# Patient Record
Sex: Female | Born: 1990
Health system: Southern US, Community
[De-identification: ages and names within clinical notes are randomized; demographics above are authoritative.]

## PROBLEM LIST (undated history)

## (undated) DIAGNOSIS — I10 Essential (primary) hypertension: Secondary | ICD-10-CM

## (undated) DIAGNOSIS — E119 Type 2 diabetes mellitus without complications: Secondary | ICD-10-CM

## (undated) DIAGNOSIS — F419 Anxiety disorder, unspecified: Secondary | ICD-10-CM

## (undated) DIAGNOSIS — F32A Depression, unspecified: Secondary | ICD-10-CM

## (undated) DIAGNOSIS — F609 Personality disorder, unspecified: Secondary | ICD-10-CM

---

## 2020-01-20 MED FILL — buPROPion HCL ER (SR) 100 M: 100 | 90 days supply | Qty: 180 | Fill #0

## 2020-01-20 MED FILL — SERTRALINE HCL 100 MG TAB: 100 | 90 days supply | Qty: 90 | Fill #0

## 2020-02-10 DIAGNOSIS — F4323 Adjustment disorder with mixed anxiety and depressed mood: Secondary | ICD-10-CM | POA: Diagnosis not present

## 2020-02-10 MED FILL — ESCITALOPRAM 20 MG TABLET: 20 | 30 days supply | Qty: 30 | Fill #0

## 2020-02-11 DIAGNOSIS — R42 Dizziness and giddiness: Secondary | ICD-10-CM | POA: Diagnosis not present

## 2020-02-11 DIAGNOSIS — E119 Type 2 diabetes mellitus without complications: Secondary | ICD-10-CM | POA: Diagnosis not present

## 2020-02-11 DIAGNOSIS — N915 Oligomenorrhea, unspecified: Secondary | ICD-10-CM | POA: Diagnosis not present

## 2020-02-11 DIAGNOSIS — I951 Orthostatic hypotension: Secondary | ICD-10-CM | POA: Diagnosis not present

## 2020-02-11 MED FILL — MECLIZINE 25 MG TABLET: 25 | 5 days supply | Qty: 10 | Fill #0

## 2020-02-18 DIAGNOSIS — I10 Essential (primary) hypertension: Secondary | ICD-10-CM | POA: Diagnosis not present

## 2020-02-18 DIAGNOSIS — E789 Disorder of lipoprotein metabolism, unspecified: Secondary | ICD-10-CM | POA: Diagnosis not present

## 2020-02-18 DIAGNOSIS — Z1159 Encounter for screening for other viral diseases: Secondary | ICD-10-CM | POA: Diagnosis not present

## 2020-02-18 DIAGNOSIS — E119 Type 2 diabetes mellitus without complications: Secondary | ICD-10-CM | POA: Diagnosis not present

## 2020-02-18 DIAGNOSIS — I951 Orthostatic hypotension: Secondary | ICD-10-CM | POA: Diagnosis not present

## 2020-02-18 DIAGNOSIS — R42 Dizziness and giddiness: Secondary | ICD-10-CM | POA: Diagnosis not present

## 2020-02-18 DIAGNOSIS — E559 Vitamin D deficiency, unspecified: Secondary | ICD-10-CM | POA: Diagnosis not present

## 2020-02-18 MED FILL — VIT D2 1.25 MG (50,000 UNIT: 1.25 MG | 90 days supply | Qty: 13 | Fill #0

## 2020-02-18 MED FILL — ROSUVASTATIN CALCIUM 20 MG: 20 | 90 days supply | Qty: 90 | Fill #0

## 2020-02-25 DIAGNOSIS — I10 Essential (primary) hypertension: Secondary | ICD-10-CM | POA: Diagnosis not present

## 2020-02-25 DIAGNOSIS — E789 Disorder of lipoprotein metabolism, unspecified: Secondary | ICD-10-CM | POA: Diagnosis not present

## 2020-02-25 DIAGNOSIS — E119 Type 2 diabetes mellitus without complications: Secondary | ICD-10-CM | POA: Diagnosis not present

## 2020-02-25 DIAGNOSIS — R42 Dizziness and giddiness: Secondary | ICD-10-CM | POA: Diagnosis not present

## 2020-02-26 DIAGNOSIS — R5383 Other fatigue: Secondary | ICD-10-CM | POA: Diagnosis not present

## 2020-02-26 DIAGNOSIS — E1165 Type 2 diabetes mellitus with hyperglycemia: Secondary | ICD-10-CM | POA: Diagnosis not present

## 2020-02-26 DIAGNOSIS — Z7251 High risk heterosexual behavior: Secondary | ICD-10-CM | POA: Diagnosis not present

## 2020-02-26 DIAGNOSIS — R0602 Shortness of breath: Secondary | ICD-10-CM | POA: Diagnosis not present

## 2020-02-26 DIAGNOSIS — R87612 Low grade squamous intraepithelial lesion on cytologic smear of cervix (LGSIL): Secondary | ICD-10-CM | POA: Diagnosis not present

## 2020-02-26 DIAGNOSIS — Z1159 Encounter for screening for other viral diseases: Secondary | ICD-10-CM | POA: Diagnosis not present

## 2020-02-26 DIAGNOSIS — N898 Other specified noninflammatory disorders of vagina: Secondary | ICD-10-CM | POA: Diagnosis not present

## 2020-03-11 DIAGNOSIS — D500 Iron deficiency anemia secondary to blood loss (chronic): Secondary | ICD-10-CM | POA: Diagnosis not present

## 2020-03-11 DIAGNOSIS — R87612 Low grade squamous intraepithelial lesion on cytologic smear of cervix (LGSIL): Secondary | ICD-10-CM | POA: Diagnosis not present

## 2020-03-11 DIAGNOSIS — I10 Essential (primary) hypertension: Secondary | ICD-10-CM | POA: Diagnosis not present

## 2020-03-11 DIAGNOSIS — E119 Type 2 diabetes mellitus without complications: Secondary | ICD-10-CM | POA: Diagnosis not present

## 2020-03-18 DIAGNOSIS — F4323 Adjustment disorder with mixed anxiety and depressed mood: Secondary | ICD-10-CM | POA: Diagnosis not present

## 2020-03-22 DIAGNOSIS — Z03818 Encounter for observation for suspected exposure to other biological agents ruled out: Secondary | ICD-10-CM | POA: Diagnosis not present

## 2020-03-22 DIAGNOSIS — Z20828 Contact with and (suspected) exposure to other viral communicable diseases: Secondary | ICD-10-CM | POA: Diagnosis not present

## 2020-04-14 DIAGNOSIS — I10 Essential (primary) hypertension: Secondary | ICD-10-CM | POA: Diagnosis not present

## 2020-04-14 DIAGNOSIS — E559 Vitamin D deficiency, unspecified: Secondary | ICD-10-CM | POA: Diagnosis not present

## 2020-04-14 DIAGNOSIS — R1013 Epigastric pain: Secondary | ICD-10-CM | POA: Diagnosis not present

## 2020-04-14 DIAGNOSIS — E119 Type 2 diabetes mellitus without complications: Secondary | ICD-10-CM | POA: Diagnosis not present

## 2020-04-14 DIAGNOSIS — N915 Oligomenorrhea, unspecified: Secondary | ICD-10-CM | POA: Diagnosis not present

## 2020-04-14 DIAGNOSIS — E789 Disorder of lipoprotein metabolism, unspecified: Secondary | ICD-10-CM | POA: Diagnosis not present

## 2020-06-03 MED FILL — ESCITALOPRAM 20 MG TABLET: 20 | 30 days supply | Qty: 30 | Fill #0

## 2020-06-03 MED FILL — buPROPion HCL ER (SR) 100 M: 100 | 90 days supply | Qty: 180 | Fill #0

## 2020-08-27 ENCOUNTER — Emergency Department (INDEPENDENT_AMBULATORY_CARE_PROVIDER_SITE_OTHER)
Admission: RE | Admit: 2020-08-27 | Discharge: 2020-08-27 | Disposition: A | Discharge status: Home / Self Care | Payer: Self-pay | Source: Ambulatory Visit

## 2020-08-27 ENCOUNTER — Other Ambulatory Visit (HOSPITAL_COMMUNITY)
Admission: RE | Admit: 2020-08-27 | Discharge: 2020-08-27 | Disposition: A | Discharge status: Home / Self Care | Payer: Self-pay | Source: Ambulatory Visit | Attending: Family Medicine | Admitting: Family Medicine

## 2020-08-27 ENCOUNTER — Other Ambulatory Visit: Payer: Self-pay

## 2020-08-27 VITALS — BP 128/84 | HR 101 | Temp 98.0°F | Resp 18

## 2020-08-27 DIAGNOSIS — R300 Dysuria: Secondary | ICD-10-CM

## 2020-08-27 DIAGNOSIS — B373 Candidiasis of vulva and vagina: Secondary | ICD-10-CM

## 2020-08-27 DIAGNOSIS — B3731 Acute candidiasis of vulva and vagina: Secondary | ICD-10-CM

## 2020-08-27 LAB — POCT URINALYSIS DIP (MANUAL ENTRY)
Bilirubin, UA: NEGATIVE
Blood, UA: NEGATIVE
Glucose, UA: >=1000 mg/dL — AB
Leukocytes, UA: NEGATIVE
Nitrite, UA: NEGATIVE
Protein Ur, POC: NEGATIVE mg/dL
Spec Grav, UA: 1.015 (ref 1.010–1.025)
Urobilinogen, UA: 0.2 E.U./dL
pH, UA: 5 (ref 5.0–8.0)

## 2020-08-27 MED ORDER — NYSTATIN 100000 UNIT/GM EX CREA: TOPICAL_CREAM | CUTANEOUS | 0 refills | Status: DC

## 2020-08-27 MED ORDER — FLUCONAZOLE 200 MG PO TABS: 200.0000 mg | ORAL_TABLET | Freq: Every day | ORAL | 0 refills | Status: DC

## 2020-08-27 NOTE — ED Provider Notes (Signed)
[facility]    CSN: [id number] Arrival date & time: 08/27/20  1728      History   Chief Complaint Chief Complaint  Patient presents with  . Vaginitis    HPI Kelly Sweeney is a 29 y.o. female.   HPI  Patient with a history of diabetes presents with recurrent vaginitis symptoms. She endorses vaginal itching, thick vaginal discharge,   and burning. Denies urine burns skin when urinating no actual dysuria. Reports she is getting diabetes under control. She has a PCP. History reviewed. No pertinent past medical history.  There are no problems to display for this patient.   History reviewed. No pertinent surgical history.  OB History   No obstetric history on file.      Home Medications    Prior to Admission medications   Medication Sig Start Date End Date Taking? Authorizing Provider  buPROPion (WELLBUTRIN SR) 100 MG 12 hr tablet Take by mouth. 06/03/20  Yes [provider]  Dexlansoprazole (DEXILANT) 30 MG capsule Take by mouth. 08/03/20  Yes [provider]  escitalopram (LEXAPRO) 20 MG tablet Take by mouth. 06/03/20  Yes [provider]  lisinopril (ZESTRIL) 20 MG tablet Take by mouth. 11/05/19  Yes [provider]  metFORMIN (GLUCOPHAGE-XR) 500 MG 24 hr tablet Take by mouth. 06/22/20  Yes [provider]  rosuvastatin (CRESTOR) 20 MG tablet Take by mouth. 02/18/20  Yes [provider]  Semaglutide, 1 MG/DOSE, (OZEMPIC, 1 MG/DOSE,) 2 MG/1.5ML SOPN Inject into the skin. 08/03/20  Yes [provider]    Family History History reviewed. No pertinent family history.  Social History Social History   Tobacco Use  . Smoking status: Never Smoker  . Smokeless tobacco: Never Used  Vaping Use  . Vaping Use: Never used  Substance Use Topics  . Alcohol use: Not on file  . Drug use: Not Currently     Allergies   Patient has no known allergies. Review of Systems Review of Systems Pertinent negatives  listed in HPI  Physical Exam Triage Vital Signs ED Triage Vitals  Enc Vitals Group     BP 08/27/20 1746 128/84     Pulse Rate 08/27/20 1746 (!) 101     Resp 08/27/20 1746 18     Temp 08/27/20 1746 98 F (36.7 C)     Temp Source 08/27/20 1746 Oral     SpO2 08/27/20 1746 99 %     Weight --      Height --      Head Circumference --      Peak Flow --      Pain Score 08/27/20 1747 0     Pain Loc --      Pain Edu? --      Excl. in GC? --    No data found.  Updated Vital Signs BP 128/84 (BP Location: Right Arm)   Pulse (!) 101   Temp 98 F (36.7 C) (Oral)   Resp 18   LMP 07/28/2020 (Exact Date)   SpO2 99%   Visual Acuity Right Eye Distance:   Left Eye Distance:   Bilateral Distance:    Right Eye Near:   Left Eye Near:    Bilateral Near:     Physical Exam General appearance: alert, well developed, well nourished, cooperative and in no distress Head: Normocephalic, without obvious abnormality, atraumatic Respiratory: Respirations even and unlabored, normal respiratory rate Heart: Increase heart rate and rhythm normal. No gallop or murmurs Skin: Skin color,  texture, turgor normal. No rashes seen  Psych: Appropriate mood and affect. Vaginal cytology self collected  UC Treatments / Results  Labs (all labs ordered are listed, but only abnormal results are displayed) Labs Reviewed - No data to display  EKG   Radiology No results found.  Procedures Procedures (including critical care time)  Medications Ordered in UC Medications - No data to display  Initial Impression / Assessment and Plan / UC Course  I have reviewed the triage vital signs and the nursing notes.  Pertinent labs & imaging results that were available during my care of the patient were reviewed by me and considered in my medical decision making (see chart for details).    Vaginal cytology pending. Treating for acute candidal infection given symptoms. UA negative with the exception glucose in  urine. Patient advised to follow-up with PCP for ongoing diabetes management. Final Clinical Impressions(s) / UC Diagnoses   Final diagnoses:  Candidiasis of female genitalia  Dysuria     Discharge Instructions     Vaginal cytology will results within 3-5 days. If vaginal cytology is significant for bacterial vaginosis , I will prescribed metronidazole at that time.    ED Prescriptions    Medication Sig Dispense Auth. Provider   fluconazole (DIFLUCAN) 200 MG tablet Take 1 tablet (200 mg total) by mouth daily for 7 days. 7 tablet Bing Neighbors, FNP   nystatin cream (MYCOSTATIN) Apply to affected area 2 times daily 120 g Bing Neighbors, FNP     PDMP not reviewed this encounter.   Bing Neighbors, FNP 08/30/20 1520

## 2020-08-27 NOTE — Discharge Instructions (Addendum)
Vaginal cytology will results within 3-5 days. If vaginal cytology is significant for bacterial vaginosis , I will prescribed metronidazole at that time.

## 2020-08-27 NOTE — ED Triage Notes (Signed)
Pt c/o possible yeast infection sxs x 1 week. Vaginal itching and irritation. Was treated in July for an extended time for BV, (generic flagyl 56 tabs worth) which she says cleared up a lot but never completely went away.

## 2020-08-28 ENCOUNTER — Telehealth: Payer: Self-pay | Admitting: Emergency Medicine

## 2020-08-28 DIAGNOSIS — B3731 Acute candidiasis of vulva and vagina: Secondary | ICD-10-CM | POA: Insufficient documentation

## 2020-08-28 MED ORDER — FLUCONAZOLE 200 MG PO TABS: 200.0000 mg | ORAL_TABLET | Freq: Every day | ORAL | 0 refills | Status: AC

## 2020-08-28 MED ORDER — NYSTATIN 100000 UNIT/GM EX CREA
TOPICAL_CREAM | CUTANEOUS | 0 refills | Status: DC
Start: 2020-08-28 — End: 2022-07-17

## 2020-08-28 NOTE — Telephone Encounter (Signed)
Call from pt at 0806 about no meds - RN to call pharmacy to check status.Call from pt to check on e-scripts for medication prescribed yesterday. Pt stated medicine was not available at [facility]. Attempt to call pharmacy - on hold - no answer for 15 min. Call back from pt regarding meds while on hold. Will resend meds to pharmacy.

## 2020-08-28 NOTE — Telephone Encounter (Signed)
2nd attempt to call pharmacy - on hold for 12 min- call back at 1200, message left regarding a call back about pt's meds to confirm receipt of e-script or if a a verbal was needed

## 2020-08-28 NOTE — Telephone Encounter (Signed)
Call back to pt - message left regarding meds - pt had called back & spoken to Delice Bison about meds being sent electronically to [facility]- electronic scripting is not sending scripts correctly. Verbal prescription to [facility] [address] in [place] & should be available for pick up. Pt had told Delice Bison she was going out of town. RN apologized for delay in med refill.

## 2020-08-31 LAB — CERVICOVAGINAL ANCILLARY ONLY
Bacterial Vaginitis (gardnerella): NEGATIVE
Candida Glabrata: NEGATIVE
Candida Vaginitis: POSITIVE — AB
Chlamydia: NEGATIVE
Comment: NEGATIVE
Comment: NEGATIVE
Comment: NEGATIVE
Comment: NEGATIVE
Comment: NEGATIVE
Comment: NORMAL
Neisseria Gonorrhea: NEGATIVE
Trichomonas: NEGATIVE

## 2020-09-02 ENCOUNTER — Telehealth: Payer: Self-pay | Admitting: Emergency Medicine

## 2020-09-02 NOTE — Telephone Encounter (Signed)
Call from [facility] regarding lab results - all negative except for yeast which patient was treated for at visit. RN suggested pt follow up with GYN or PCP for continued vulvar itching. Pt hung up on nurse

## 2021-02-18 ENCOUNTER — Other Ambulatory Visit: Payer: Self-pay

## 2021-02-18 ENCOUNTER — Emergency Department (HOSPITAL_BASED_OUTPATIENT_CLINIC_OR_DEPARTMENT_OTHER)
Admission: EM | Admit: 2021-02-18 | Discharge: 2021-02-18 | Disposition: A | Discharge status: Home / Self Care | Payer: PRIVATE HEALTH INSURANCE | Attending: Emergency Medicine | Admitting: Emergency Medicine

## 2021-02-18 ENCOUNTER — Encounter (HOSPITAL_BASED_OUTPATIENT_CLINIC_OR_DEPARTMENT_OTHER): Payer: Self-pay

## 2021-02-18 DIAGNOSIS — N760 Acute vaginitis: Secondary | ICD-10-CM | POA: Insufficient documentation

## 2021-02-18 DIAGNOSIS — I10 Essential (primary) hypertension: Secondary | ICD-10-CM | POA: Diagnosis not present

## 2021-02-18 DIAGNOSIS — Z79899 Other long term (current) drug therapy: Secondary | ICD-10-CM | POA: Insufficient documentation

## 2021-02-18 DIAGNOSIS — E119 Type 2 diabetes mellitus without complications: Secondary | ICD-10-CM | POA: Diagnosis not present

## 2021-02-18 DIAGNOSIS — B9689 Other specified bacterial agents as the cause of diseases classified elsewhere: Secondary | ICD-10-CM

## 2021-02-18 DIAGNOSIS — M545 Low back pain, unspecified: Secondary | ICD-10-CM | POA: Diagnosis not present

## 2021-02-18 DIAGNOSIS — Z794 Long term (current) use of insulin: Secondary | ICD-10-CM | POA: Diagnosis not present

## 2021-02-18 DIAGNOSIS — Z7984 Long term (current) use of oral hypoglycemic drugs: Secondary | ICD-10-CM | POA: Diagnosis not present

## 2021-02-18 DIAGNOSIS — R350 Frequency of micturition: Secondary | ICD-10-CM | POA: Diagnosis present

## 2021-02-18 HISTORY — DX: Essential (primary) hypertension: I10

## 2021-02-18 HISTORY — DX: Type 2 diabetes mellitus without complications: E11.9

## 2021-02-18 LAB — URINALYSIS, ROUTINE W REFLEX MICROSCOPIC
Bilirubin Urine: NEGATIVE
Glucose, UA: NEGATIVE mg/dL
Hgb urine dipstick: NEGATIVE
Ketones, ur: 15 mg/dL — AB
Leukocytes,Ua: NEGATIVE
Nitrite: NEGATIVE
Protein, ur: NEGATIVE mg/dL
Specific Gravity, Urine: >1.03 — ABNORMAL HIGH (ref 1.005–1.030)
pH: 6 (ref 5.0–8.0)

## 2021-02-18 LAB — WET PREP, GENITAL
Sperm: NONE SEEN
Trich, Wet Prep: NONE SEEN
Yeast Wet Prep HPF POC: NONE SEEN

## 2021-02-18 LAB — PREGNANCY, URINE: Preg Test, Ur: NEGATIVE

## 2021-02-18 MED ORDER — METRONIDAZOLE 500 MG PO TABS: 500.0000 mg | ORAL_TABLET | Freq: Two times a day (BID) | ORAL | 0 refills | Status: DC

## 2021-02-18 NOTE — ED Provider Notes (Signed)
[facility] EMERGENCY DEPARTMENT Provider Note   CSN: [id number] Arrival date & time: 02/18/21  1926     History Chief Complaint  Patient presents with  . Flank Pain    Kelly Sweeney is a 30 y.o. female with a past medical history of DM, hypertension, who presents today for evaluation of pain in her lower back and side, left greater than right for a week.  She states that she just finished a week of Macrobid.  She had a telemedicine visit and based on her dysuria and frequency was diagnosed with a UTI.  She did not give a urine sample.  She states that her dysuria has improved however she still has frequency and her back pain.  She is sexually active.  She denies any abnormal discharge however does state that her partner has had multiple other partners.  She states that she has had HIV and syphilis testing since she found this out however has not had testing for gonorrhea and chlamydia.  She denies any fevers..  She states that she had a urinary tract in January of this year and was treated with Augmentin.  She denies any fevers.    HPI     Past Medical History:  Diagnosis Date  . Diabetes mellitus without complication (HCC)   . Hypertension     Patient Active Problem List   Diagnosis Date Noted  . Yeast infection involving the vagina and surrounding area 08/28/2020    History reviewed. No pertinent surgical history.   OB History   No obstetric history on file.     No family history on file.  Social History   Tobacco Use  . Smoking status: Never Smoker  . Smokeless tobacco: Never Used  Vaping Use  . Vaping Use: Never used  Substance Use Topics  . Alcohol use: Never  . Drug use: Not Currently    Home Medications Prior to Admission medications   Medication Sig Start Date End Date Taking? Authorizing Provider  metroNIDAZOLE (FLAGYL) 500 MG tablet Take 1 tablet (500 mg total) by mouth 2 (two) times daily. 02/18/21  Yes Cristina Gong, PA-C   buPROPion [facility] SR) 100 MG 12 hr tablet Take by mouth. 06/03/20   [provider]  Dexlansoprazole (DEXILANT) 30 MG capsule Take by mouth. 08/03/20   [provider]  escitalopram (LEXAPRO) 20 MG tablet Take by mouth. 06/03/20   [provider]  lisinopril (ZESTRIL) 20 MG tablet Take by mouth. 11/05/19   [provider]  metFORMIN (GLUCOPHAGE-XR) 500 MG 24 hr tablet Take by mouth. 06/22/20   [provider]  nystatin cream (MYCOSTATIN) Apply to affected area 2 times daily 08/28/20   Bing Neighbors, FNP  rosuvastatin (CRESTOR) 20 MG tablet Take by mouth. 02/18/20   [provider]  Semaglutide, 1 MG/DOSE, (OZEMPIC, 1 MG/DOSE,) 2 MG/1.5ML SOPN Inject into the skin. 08/03/20   [provider]    Allergies    Patient has no known allergies.  Review of Systems   Review of Systems  Constitutional: Negative for chills and fever.  Respiratory: Negative for shortness of breath.   Cardiovascular: Negative for chest pain.  Gastrointestinal: Negative for abdominal pain.  Genitourinary: Positive for frequency. Negative for difficulty urinating, dysuria, pelvic pain, urgency and vaginal discharge.  Musculoskeletal: Positive for back pain.  Neurological: Negative for weakness and headaches.  Psychiatric/Behavioral: The patient is nervous/anxious ("My heart rate is normally high because I get anxious").   All other systems reviewed  and are negative.   Physical Exam Updated Vital Signs BP 112/79 (BP Location: Left Arm)   Pulse 95   Temp 98.3 F (36.8 C) (Oral)   Resp 17   Ht 5\' 9"  (1.753 m)   Wt (!) 140.2 kg   LMP 02/06/2021   SpO2 100%   BMI 45.63 kg/m   Physical Exam Vitals and nursing note reviewed.  Constitutional:      General: She is not in acute distress.    Appearance: She is not diaphoretic.  HENT:     Head: Normocephalic and atraumatic.  Eyes:     General: No scleral icterus.       Right eye: No discharge.         Left eye: No discharge.     Conjunctiva/sclera: Conjunctivae normal.  Cardiovascular:     Rate and Rhythm: Normal rate and regular rhythm.  Pulmonary:     Effort: Pulmonary effort is normal. No respiratory distress.     Breath sounds: No stridor.  Abdominal:     General: There is no distension.     Tenderness: There is no abdominal tenderness. There is no right CVA tenderness, left CVA tenderness or guarding.  Musculoskeletal:        General: No deformity.     Cervical back: Normal range of motion.     Comments: Mild tenderness to palpation in bilateral lower back.  No midline L-spine tenderness to palpation, step-offs, or deformities.  Skin:    General: Skin is warm and dry.  Neurological:     Mental Status: She is alert.     Motor: No abnormal muscle tone.     Comments: Patient is awake and alert, answers questions appropriately.  Speech is not slurred.  Psychiatric:        Behavior: Behavior normal.     ED Results / Procedures / Treatments   Labs (all labs ordered are listed, but only abnormal results are displayed) Labs Reviewed  WET PREP, GENITAL - Abnormal; Notable for the following components:      Result Value   Clue Cells Wet Prep HPF POC PRESENT (*)    WBC, Wet Prep HPF POC FEW (*)    All other components within normal limits  URINALYSIS, ROUTINE W REFLEX MICROSCOPIC - Abnormal; Notable for the following components:   Specific Gravity, Urine >1.030 (*)    Ketones, ur 15 (*)    All other components within normal limits  URINE CULTURE  PREGNANCY, URINE  GC/CHLAMYDIA PROBE AMP ([place]) NOT AT [facility]    EKG None  Radiology No results found.  Procedures Procedures   Medications Ordered in ED Medications - No data to display  ED Course  I have reviewed the triage vital signs and the nursing notes.  Pertinent labs & imaging results that were available during my care of the patient were reviewed by me and considered in my medical decision making (see  chart for details).  Clinical Course as of 02/18/21 2329  2330 Feb 18, 2021  2036 Discussed patient's UA and pregnancy results with her.  She then requested testing for STDs.  She states that she recently found out that her partner has had multiple other partners and they did not use barrier protection.  She states that she is not having discharge.  We discussed role of pelvic exam versus self swab.  We discussed that without pelvic exam unable to evaluate and detect pelvic inflammatory disease and the possible implications of delaying or  missing this diagnosis including worsening infection, loss of fertility and others.  She states her understanding and wishes to self swab at this time.  She does not wish for empiric treatment. [EH]    Clinical Course User Index [EH] Norman Clay   MDM Rules/Calculators/A&P                         Patient is a 30 year old woman who presents today for evaluation of back pain and urinary frequency.  She states that she had been seen a week ago through a telemedicine visit without getting a urine sample and started on Macrobid for UTI which improved the dysuria however she still has frequency.  She has pain in her lower back that is worsened with palpation over the lateral lumbar paraspinal muscles without CVA tenderness to percussion.  Urine is obtained showing dehydration and mild ketonuria without evidence of infection.  Pregnancy test is negative.  After this result patient then requested STI testing for gonorrhea and chlamydia.  She declined testing for HIV and syphilis. We discussed pelvic exam.  We discussed that if she chooses to self swab that limits my ability to evaluate for PID and other conditions.  We discussed possible risks of this decision, she states her understanding and wishes to self swab which was obtained.  Wet prep does show BV.  She is given a prescription for Flagyl with instructions not to drink alcohol while taking this. GC  testing is sent.  She refused empiric treatment, and is aware that she will need to follow-up on these results.  She does not have significant abdominal pain on exam, and I am able to recreate and exacerbate her bilateral low back pain with palpation.  I suspect musculoskeletal pain.  She states that she sits for extended periods of time for work.  Recommended outpatient follow-up.  Her heart rate was slightly elevated however she states this is normal for her and attributes that to anxiety being in the emergency room.  This is supported by her heart rate improving spontaneously without specific treatment while in the ER.  Return precautions were discussed with patient who states their understanding.  At the time of discharge patient denied any unaddressed complaints or concerns.  Patient is agreeable for discharge home.  Note: Portions of this report may have been transcribed using voice recognition software. Every effort was made to ensure accuracy; however, inadvertent computerized transcription errors may be present  Final Clinical Impression(s) / ED Diagnoses Final diagnoses:  BV (bacterial vaginosis)  Acute bilateral low back pain without sciatica    Rx / DC Orders ED Discharge Orders         Ordered    metroNIDAZOLE (FLAGYL) 500 MG tablet  2 times daily        02/18/21 2107           Norman Clay 02/18/21 2329    Terrilee Files, MD 02/19/21 1104

## 2021-02-18 NOTE — Discharge Instructions (Signed)
You have test for gonorrhea and chlamydia pending.  If these are positive you will need to seek treatment.  If they are positive you should receive a call, however I would also recommend following these in your MyChart.  A registration code for MyChart if you do not already have it will be included on your discharge papers. Please monitor yourself.  If you develop fevers, worsening symptoms or have other concerns please seek additional medical care and evaluation. Please schedule a follow-up appointment with your primary care doctor and/or OB/GYN to follow-up on your concerns today.  You may have diarrhea from the antibiotics.  It is very important that you continue to take the antibiotics even if you get diarrhea unless a medical professional tells you that you may stop taking them.  If you stop too early the bacteria you are being treated for will become stronger and you may need different, more powerful antibiotics that have more side effects and worsening diarrhea.  Please stay well hydrated and consider probiotics as they may decrease the severity of your diarrhea.  Please be aware that if you take any hormonal contraception (birth control pills, nexplanon, the ring, etc) that your birth control will not work while you are taking antibiotics and you need to use back up protection as directed on the birth control medication information insert.   Today your diagnosed with bacterial vaginosis and received a prescription for metronidazole also known as Flagyl. It is very important that you do not consume any alcohol while taking this medication as it will cause you to become violently ill.

## 2021-02-18 NOTE — ED Triage Notes (Signed)
Pt arrives with c/o lower back pain and side pain (left and right) states that she just finished a 7 day course of antibiotics for a UTI.

## 2021-02-19 ENCOUNTER — Ambulatory Visit (HOSPITAL_COMMUNITY): Payer: Self-pay

## 2021-02-20 LAB — URINE CULTURE

## 2021-02-21 LAB — GC/CHLAMYDIA PROBE AMP (~~LOC~~) NOT AT ARMC
Chlamydia: NEGATIVE
Comment: NEGATIVE
Comment: NORMAL
Neisseria Gonorrhea: NEGATIVE

## 2021-04-07 ENCOUNTER — Emergency Department (HOSPITAL_COMMUNITY)
Admission: EM | Admit: 2021-04-07 | Discharge: 2021-04-07 | Disposition: A | Discharge status: Home / Self Care | Payer: PRIVATE HEALTH INSURANCE | Attending: Emergency Medicine | Admitting: Emergency Medicine

## 2021-04-07 ENCOUNTER — Encounter (HOSPITAL_COMMUNITY): Payer: Self-pay

## 2021-04-07 DIAGNOSIS — R45851 Suicidal ideations: Secondary | ICD-10-CM | POA: Insufficient documentation

## 2021-04-07 DIAGNOSIS — E119 Type 2 diabetes mellitus without complications: Secondary | ICD-10-CM | POA: Insufficient documentation

## 2021-04-07 DIAGNOSIS — Z7984 Long term (current) use of oral hypoglycemic drugs: Secondary | ICD-10-CM | POA: Diagnosis not present

## 2021-04-07 DIAGNOSIS — I10 Essential (primary) hypertension: Secondary | ICD-10-CM | POA: Diagnosis not present

## 2021-04-07 DIAGNOSIS — F411 Generalized anxiety disorder: Secondary | ICD-10-CM | POA: Diagnosis not present

## 2021-04-07 DIAGNOSIS — F419 Anxiety disorder, unspecified: Secondary | ICD-10-CM

## 2021-04-07 DIAGNOSIS — Z79899 Other long term (current) drug therapy: Secondary | ICD-10-CM | POA: Insufficient documentation

## 2021-04-07 MED ORDER — HYDROXYZINE HCL 25 MG PO TABS: 25.0000 mg | ORAL_TABLET | Freq: Three times a day (TID) | ORAL | 0 refills | Status: DC | PRN

## 2021-04-07 NOTE — Discharge Instructions (Addendum)
You came to the emergency department today to be evaluated for your anxiety and suicidal thoughts.  After speaking with a psychiatric provider they deemed you safe to return home and follow-up with your psychiatrist.  I have given you information to follow-up with a local therapist.  I have started you on Vistaril medication.  You may take 1 pill every 8 hours as needed for anxiety.  Please see attached information on Vistaril medication as well as suicidal feelings, and anxiety.  Get help right away if: You are talking about suicide or wishing to die. You start making plans for how to commit suicide. You feel that you have no reason to live. You start making plans for putting your affairs in order, saying goodbye, or giving your possessions away. You feel guilt, shame, or unbearable pain, and it seems like there is no way out. You are frequently using drugs or alcohol. You are engaging in risky behaviors that could lead to death.

## 2021-04-07 NOTE — ED Notes (Signed)
Provider declines lab work at this time. Waiting on TTS consult.

## 2021-04-07 NOTE — ED Notes (Signed)
Pt belongings removed and patient changed into maroon scrubs. Pt belongings x1 bag placed in triage lockers.

## 2021-04-07 NOTE — ED Triage Notes (Signed)
Pt presents via EMS for anxiety and insomnia. Per EMS, pt has had recent changes in her anxiety medications. She is no longer taking Xanax or Wellbutrin. Pt is only taking Lexapro at this time which she states she started around two to three weeks ago. Pt has a follow-up appointment with her prescriber next week. Pt denies any HI or SI at this time and states she has been under increased stress d/t family member health issues.

## 2021-04-07 NOTE — ED Provider Notes (Signed)
[facility]-EMERGENCY DEPT Provider Note   CSN: [id number] Arrival date & time: 04/07/21  1228     History Chief Complaint  Patient presents with  . Anxiety    Kelly Sweeney is a 30 y.o. female with history of anxiety, depression, hypertension, diabetes.  Patient presents with chief complaint of anxiety and insomnia.  Patient states that she is been having increased anxiety over the last 4 weeks.  Patient woke up this morning approximately 300 with worsening anxiety.  Patient has been very anxious throughout the entire day.  Patient has been under more stress recently due to family and personal issues.  Patient states that she was just started on Lexapro on 4/11.  Prior to this patient has been off of any medications for the last 6 months.  In the past patient had previously taken Wellbutrin.  Patient sees a psychiatrist Dr. Janann August however his office is located in [place] where the patient previously lived.  Patient endorses suicidal ideations.  Patient reports that she has been having suicidal thoughts for the last 4 weeks.  Patient's last suicidal thought occurred 2 weeks prior.  Patient's plan is to commit suicide by overdosing on sleeping pills.  Patient states she does not have these pills but would go to the store to get them.  Denies any suicidal thoughts at present.  Patient states that in the past she has had suicidal thoughts but has never attempted suicide.  Patient denies any previous psychiatric hospitalization.  Patient denies having any guns or weapons at the house.  Patient denies any homicidal ideations, auditory hallucinations, or visual hallucinations.  Patient denies any complaints of pain or illness.   HPI     Past Medical History:  Diagnosis Date  . Diabetes mellitus without complication (HCC)   . Hypertension     Patient Active Problem List   Diagnosis Date Noted  . Yeast infection involving the vagina and surrounding area 08/28/2020     History reviewed. No pertinent surgical history.   OB History   No obstetric history on file.     History reviewed. No pertinent family history.  Social History   Tobacco Use  . Smoking status: Never Smoker  . Smokeless tobacco: Never Used  Vaping Use  . Vaping Use: Never used  Substance Use Topics  . Alcohol use: Never  . Drug use: Not Currently    Home Medications Prior to Admission medications   Medication Sig Start Date End Date Taking? Authorizing Provider  buPROPion (WELLBUTRIN SR) 100 MG 12 hr tablet Take by mouth. 06/03/20   [provider]  Dexlansoprazole (DEXILANT) 30 MG capsule Take by mouth. 08/03/20   [provider]  escitalopram (LEXAPRO) 20 MG tablet Take by mouth. 06/03/20   [provider]  lisinopril (ZESTRIL) 20 MG tablet Take by mouth. 11/05/19   [provider]  metFORMIN (GLUCOPHAGE-XR) 500 MG 24 hr tablet Take by mouth. 06/22/20   [provider]  metroNIDAZOLE (FLAGYL) 500 MG tablet Take 1 tablet (500 mg total) by mouth 2 (two) times daily. 02/18/21   Cristina Gong, PA-C  nystatin cream (MYCOSTATIN) Apply to affected area 2 times daily 08/28/20   Bing Neighbors, FNP  rosuvastatin (CRESTOR) 20 MG tablet Take by mouth. 02/18/20   [provider]  Semaglutide, 1 MG/DOSE, (OZEMPIC, 1 MG/DOSE,) 2 MG/1.5ML SOPN Inject into the skin. 08/03/20   [provider]    Allergies    Patient has no known allergies.  Review  of Systems   Review of Systems  Constitutional: Negative for chills and fever.  Eyes: Negative for visual disturbance.  Respiratory: Negative for shortness of breath.   Cardiovascular: Negative for chest pain.  Gastrointestinal: Negative for abdominal pain, nausea and vomiting.  Genitourinary: Negative for difficulty urinating and dysuria.  Musculoskeletal: Negative for back pain and neck pain.  Skin: Negative for color change and rash.  Neurological: Negative for  dizziness, syncope, light-headedness and headaches.  Psychiatric/Behavioral: Positive for sleep disturbance and suicidal ideas. Negative for confusion, hallucinations and self-injury. The patient is nervous/anxious.     Physical Exam Updated Vital Signs BP (!) 140/95 (BP Location: Right Arm)   Pulse 74   Temp 98.8 F (37.1 C) (Oral)   Ht 5\' 9"  (1.753 m)   Wt 136.1 kg   SpO2 100%   BMI 44.30 kg/m   Physical Exam Vitals and nursing note reviewed.  Constitutional:      General: She is not in acute distress.    Appearance: She is not ill-appearing, toxic-appearing or diaphoretic.  HENT:     Head: Normocephalic.  Eyes:     General: No scleral icterus.       Right eye: No discharge.        Left eye: No discharge.  Cardiovascular:     Rate and Rhythm: Normal rate.  Pulmonary:     Effort: Pulmonary effort is normal. No respiratory distress.     Breath sounds: No stridor.  Skin:    General: Skin is warm and dry.     Coloration: Skin is not jaundiced or pale.  Neurological:     General: No focal deficit present.     Mental Status: She is alert and oriented to person, place, and time.     GCS: GCS eye subscore is 4. GCS verbal subscore is 5. GCS motor subscore is 6.     Comments: Patient moves all limbs equally without any difficulties  Psychiatric:        Attention and Perception: She is attentive. She does not perceive auditory or visual hallucinations.        Mood and Affect: Mood is anxious.        Behavior: Behavior is cooperative.        Thought Content: Thought content is not paranoid. Thought content includes suicidal ideation. Thought content does not include homicidal ideation. Thought content includes suicidal plan. Thought content does not include homicidal plan.     ED Results / Procedures / Treatments   Labs (all labs ordered are listed, but only abnormal results are displayed) Labs Reviewed - No data to display  EKG None  Radiology No results  found.  Procedures Procedures   Medications Ordered in ED Medications - No data to display  ED Course  I have reviewed the triage vital signs and the nursing notes.  Pertinent labs & imaging results that were available during my care of the patient were reviewed by me and considered in my medical decision making (see chart for details).    MDM Rules/Calculators/A&P                          Alert 30 year old female no acute distress, nontoxic-appearing.  Patient presents with chief complaint of anxiety.  Patient reports that she has had anxiety over the last 4 weeks however has been worse today.  Patient reports increased personal and family stress over this time period.  During interview patient reports that  she has had suicidal ideations intermittently over the last few weeks.  Last suicidal thoughts occurred 2 weeks prior.  Patient had plan to overdose on sleeping pills.  Patient does not have these pills but states that she would go to the store and get them.  Patient denies any suicidal thoughts at present.  She denies any homicidal ideations, visual hallucinations, auditory hallucinations, complaints of pain or illness.  Will speak to behavioral health urgent care to see if patient is appropriate to be seen there.  1400 spoke with behavioral health urgent care provider who recommended patient have TTS consult while in the emergency department to avoid unnecessary additional hospital bills.  Patient medically cleared at this time.  Will place TTS consult.  Patient was cleared by [name], NP to follow-up with her outpatient psychiatrist next Wednesday.  Recommended for patient to have a therapist in that practice.  Recommended prescription of Vistaril 25 mg p.o. 3 times daily as needed for anxiety.  Will give patient 2-week prescription of Vistaril.  Patient given resources for outpatient counseling.  Patient vies to follow-up with her psychiatrist.  Patient given strict  return precautions.  Understanding of all instructions and is agreeable with this plan.  Final Clinical Impression(s) / ED Diagnoses Final diagnoses:  Anxiety  Suicidal ideations    Rx / DC Orders ED Discharge Orders         Ordered    hydrOXYzine (ATARAX/VISTARIL) 25 MG tablet  Every 8 hours PRN        04/07/21 1730           Berneice Heinrich 04/08/21 0048    Gerhard Munch, MD 04/16/21 1224

## 2021-04-07 NOTE — BH Assessment (Signed)
Comprehensive Clinical Assessment (CCA) Note  04/07/2021 Kelly Sweeney [id number]   Disposition: Kelly Schultze, NP has psych cleared this patient to follow-up with her OP Provider, Kelly Sweeney, next Wednesday.  It is recommended that she get a therapist in that practice, talk to her doctor about increasing her Lexapro to 20 mg daily.  Meanwhile, it might be helpful to discharge her with a prescription of Vistaril 25 mg po tid/prn until she can see her psychiatrist.   The patient demonstrates the following risk factors for suicide: Chronic risk factors for suicide include: Patient has a history of depression and anxiety.  Acute risk factors for suicide include: Patient had recent suicidal thoughts, patient has unresolved grief, recent break-up. Protective factors for this patient include: Patient's family is supportive, patient does not want to die, she is engaged in a therapeutic relationship. Considering these factors, the overall suicide risk at this point appears to be moderate. Patient is appropriate for outpatient follow up.  PHQ2-9   Flowsheet Row ED from 04/07/2021 in [facility]  PHQ-2 Total Score 3  PHQ-9 Total Score 14    Flowsheet Row ED from 04/07/2021 in [facility] ED from 02/18/2021 in [facility] EMERGENCY DEPARTMENT  C-SSRS RISK CATEGORY No Risk No Risk      Patient presents to the [facility] with anxiety and panic attacks.  Patient states that she was suicidal with thoughts of overdosing two weeks ago, but denies current suicidal ideation. Patient states that she recently broke up with her boyfriend and she has never come to terms with miscarriage that she has had in the past.  Patient states that she is seeing a psychiatrist, Kelly Sweeney,  and she is on Lexapro 10 mg and states that she has been on this medication for the past month, but it does not currently appear to be helping.  Patient does not  currently have a therapist. Patient denies any prior psych hospitalizations.  Patient denies current SI/HI/Psychosis and states that she does not use any alcohol or drugs.  patient atates that she is not sleeping well, maybe 2-3 hours per night and she states that she has lost weight, 50 lbs, due toher anxiety and depression.  Patient states that she lives alone and states that she works for [employer].  Patient states that she has never been married and has no children.  Patient states that she is from [facility] and travels home on the weekend to be with her family while she is struggling with her depression and anxiety.  Patient is alert and oriented, her mood depressed and her anxiety severe.  Her judgment, insight and impulse control are mostly intact.  Her thoughts are organized and her memory intact.  She does not appear to be responding to any internal stimuli,   Chief Complaint:  Chief Complaint  Patient presents with  . Anxiety   Visit Diagnosis:  F41.1 Generalized Anxiety Disorder   CCA Screening, Triage and Referral (STR)  Patient Reported Information How did you hear about [place]? Self  Referral name: No data recorded Referral phone number: No data recorded  Whom do you see for routine medical problems? Primary Care  Practice/Facility Name: Kelly Maxwell, FNP  Practice/Facility Phone Number: No data recorded Name of Contact: No data recorded Contact Number: No data recorded Contact Fax Number: No data recorded Prescriber Name: No data recorded Prescriber Address (if known): No data recorded  What Is the  Reason for Your Visit/Call Today? Patient states that she hs been having panic attacks  How Long Has This Been Causing You Problems? 1-6 months  What Do You Feel Would Help You the Most Today? Treatment for Depression or other mood problem   Have You Recently Been in Any Inpatient Treatment (Hospital/Detox/Crisis Center/28-Day  Program)? No  Name/Location of Program/Hospital:No data recorded How Long Were You There? No data recorded When Were You Discharged? No data recorded  Have You Ever Received Services From [facility] Before? No  Who Do You See at [facility]? No data recorded  Have You Recently Had Any Thoughts About Hurting Yourself? Yes (2 weeks ago)  Are You Planning to Commit Suicide/Harm Yourself At This time? No   Have you Recently Had Thoughts About Hurting Someone Kelly Sweeney? No  Explanation: No data recorded  Have You Used Any Alcohol or Drugs in the Past 24 Hours? No  How Long Ago Did You Use Drugs or Alcohol? No data recorded What Did You Use and How Much? No data recorded  Do You Currently Have a Therapist/Psychiatrist? Yes  Name of Therapist/Psychiatrist: Erenest Sweeney   Have You Been Recently Discharged From Any Office Practice or Programs? No  Explanation of Discharge From Practice/Program: No data recorded    CCA Screening Triage Referral Assessment Type of Contact: Tele-Assessment  Is this Initial or Reassessment? Initial Assessment  Date Telepsych consult ordered in [facility]:  04/07/2021  Time Telepsych consult ordered in [facility]:  1401   Patient Reported Information Reviewed? Yes  Patient Left Without Being Seen? No data recorded Reason for Not Completing Assessment: No data recorded  Collateral Involvement: No data recorded  Does Patient Have a Court Appointed Legal Guardian? No data recorded Name and Contact of Legal Guardian: No data recorded If Minor and Not Living with Parent(s), Who has Custody? No data recorded Is CPS involved or ever been involved? Never  Is APS involved or ever been involved? Never   Patient Determined To Be At Risk for Harm To Self or Others Based on Review of Patient Reported Information or Presenting Complaint? No  Method: No data recorded Availability of Means: No data recorded Intent: No data recorded Notification Required: No data  recorded Additional Information for Danger to Others Potential: No data recorded Additional Comments for Danger to Others Potential: No data recorded Are There Guns or Other Weapons in Your Home? No data recorded Types of Guns/Weapons: No data recorded Are These Weapons Safely Secured?                            No data recorded Who Could Verify You Are Able To Have These Secured: No data recorded Do You Have any Outstanding Charges, Pending Court Dates, Parole/Probation? No data recorded Contacted To Inform of Risk of Harm To Self or Others: No data recorded  Location of Assessment: [facility] ED   Does Patient Present under Involuntary Commitment? No  IVC Papers Initial File Date: No data recorded  [place] of Residence: [place]   Patient Currently Receiving the Following Services: Medication Management   Determination of Need: No data recorded  Options For Referral: Medication Management; Outpatient Therapy     CCA Biopsychosocial Intake/Chief Complaint:  Patient presents to the [facility] with anxiety and panic attacks.  Patient states that she was suicidal with thoughts of overdosing two weeks ago, but denies current suicidal ideation. Patient states that she recently broke up with her boyfriend and  she has never come to terms with miscarriage that she has had in the past.  Patient states that she is seeing a psychiatrist, Kelly Sweeney,  and she is on Lexapro 10 mg and states that she has been on this medication for the past month, but it does not currently appear to be helping.  Patient does not currently have a therapist. Patient denies any prior psych hospitalizations.  Patient denies current SI/HI/Psychosis and states that she does not use any alcohol or drugs.  patient atates that she is not sleeping well, maybe 2-3 hours per night and she states that she has lost weight, 50 lbs, due toher anxiety and depression.  Patient states that she lives alone and states that she works for [employer].  Patient states that she has never been married and has no children.  Patient states that she is from [facility] and travels home on the weekend to be with her family while she is struggling with her depression and anxiety.  Current Symptoms/Problems: Patient has a depressed mmod, is tearful and has high anxiety   Patient Reported Schizophrenia/Schizoaffective Diagnosis in Past: No   Strengths: not assessed  Preferences: patient has no special needs that require accommodation  Abilities: Not assessed   Type of Services Patient Feels are Needed: Medication management and therapy   Initial Clinical Notes/Concerns: No data recorded  Mental Health Symptoms Depression:  Tearfulness; Weight gain/loss; Sleep (too much or little); Worthlessness; Increase/decrease in appetite   Duration of Depressive symptoms: Greater than two weeks   Mania:  Change in energy/activity   Anxiety:   Restlessness; Tension; Sleep; Worrying   Psychosis:  None   Duration of Psychotic symptoms: No data recorded  Trauma:  None   Obsessions:  None   Compulsions:  None   Inattention:  None   Hyperactivity/Impulsivity:  N/A   Oppositional/Defiant Behaviors:  None   Emotional Irregularity:  None   Other Mood/Personality Symptoms:  No data recorded   Mental Status Exam Appearance and self-care  Stature:  Average   Weight:  Overweight   Clothing:  Neat/clean; Casual   Grooming:  Well-groomed   Cosmetic use:  Age appropriate   Posture/gait:  Normal   Motor activity:  Restless   Sensorium  Attention:  Normal   Concentration:  Normal   Orientation:  Object; Person; Place; Situation; Time   Recall/memory:  Normal   Affect and Mood  Affect:  Depressed; Anxious   Mood:  Depressed; Anxious   Relating  Eye contact:  Normal   Facial expression:  Depressed; Anxious   Attitude toward examiner:  Cooperative   Thought and Language  Speech flow:  Clear and Coherent   Thought content:  Appropriate to Mood and Circumstances   Preoccupation:  None   Hallucinations:  None   Organization:  No data recorded  [employer] of Knowledge:  Average   Intelligence:  Average   Abstraction:  Functional   Judgement:  No data recorded  Reality Testing:  No data recorded  Insight:  Fair   Decision Making:  Normal   Social Functioning  Social Maturity:  Isolates   Social Judgement:  Normal   Stress  Stressors:  Relationship; Grief/losses   Coping Ability:  Deficient supports   Skill Deficits:  Decision making; Interpersonal   Supports:  No data recorded    Religion: Religion/Spirituality Are You A Religious Person?:  (not assessed)  Leisure/Recreation: Leisure / Recreation Do You  Have Hobbies?: No  Exercise/Diet: Exercise/Diet Do You Exercise?: No Have You Gained or Lost A Significant Amount of Weight in the Past Six Months?: No Do You Follow a Special Diet?: No Do You Have Any Trouble Sleeping?: No   CCA Employment/Education Employment/Work Situation: Employment / Work Situation Employment situation: Employed Where is patient currently employed?: [employer] How long has patient been employed?: since September Patient's job has been impacted by current illness: No What is the longest time patient has a held a job?: not assessed Where was the patient employed at that time?: not assessed Has patient ever been in the Eli Lilly and Companymilitary?: No  Education: Education Is Patient Currently Attending School?: No Last Grade Completed: 12 Name of High School: [employer] Did [place] Graduate From [employer]?: Yes Did Theme park managerYou Attend College?: Yes What Type of College Degree Do you Have?: Attended [employer] Did [place] Attend Graduate School?: No Did You Have An Individualized Education Program (IIEP): No Did You Have Any Difficulty At [employer]?: No Patient's Education Has Been  Impacted by Current Illness: No   CCA Family/Childhood History Family and Relationship History: Family history Marital status: Single Are you sexually active?: Yes What is your sexual orientation?: heterosexual Has your sexual activity been affected by drugs, alcohol, medication, or emotional stress?: N/A Does patient have children?: No  Childhood History:  Childhood History By whom was/is the patient raised?: Both parents Description of patient's relationship with caregiver when they were a child: Patient states that she had a close relationship with her parents Patient's description of current relationship with people who raised him/her: Patient states that she has a close relationship with her family How were you disciplined when you got in trouble as a child/adolescent?: not assessed Does patient have siblings?: Yes Number of Siblings:  (not assessed) Description of patient's current relationship with siblings: patient states that she is closeto her siblings Did patient suffer any verbal/emotional/physical/sexual abuse as a child?: No Did patient suffer from severe childhood neglect?: No Has patient ever been sexually abused/assaulted/raped as an adolescent or adult?: No Was the patient ever a victim of a crime or a disaster?: No Witnessed domestic violence?: No Has patient been affected by domestic violence as an adult?: No  Child/Adolescent Assessment:     CCA Substance Use Alcohol/Drug Use: Alcohol / Drug Use Pain Medications: see MAR Prescriptions: see MAR Over the Counter: see MAR History of alcohol / drug use?: No history of alcohol / drug abuse                         ASAM's:  Six Dimensions of Multidimensional Assessment  Dimension 1:  Acute Intoxication and/or Withdrawal Potential:      Dimension 2:  Biomedical Conditions and Complications:      Dimension 3:  Emotional, Behavioral, or Cognitive Conditions and Complications:     Dimension 4:   Readiness to Change:     Dimension 5:  Relapse, Continued use, or Continued Problem Potential:     Dimension 6:  Recovery/Living Environment:     ASAM Severity Score:    ASAM Recommended Level of Treatment:     Substance use Disorder (SUD)    Recommendations for Services/Supports/Treatments:    DSM5 Diagnoses: Patient Active Problem List   Diagnosis Date Noted  . Yeast infection involving the vagina and surrounding area 08/28/2020       Referrals to Alternative Service(s): Referred to Alternative Service(s):   Place:  Date:   Time:    Referred to Alternative Service(s):   Place:   Date:   Time:    Referred to Alternative Service(s):   Place:   Date:   Time:    Referred to Alternative Service(s):   Place:   Date:   Time:     Ione [place]usky J Micheal Murad, LCAS

## 2021-04-07 NOTE — ED Notes (Signed)
TTS assessment in progress. 

## 2021-06-10 ENCOUNTER — Encounter (HOSPITAL_BASED_OUTPATIENT_CLINIC_OR_DEPARTMENT_OTHER): Payer: Self-pay | Admitting: *Deleted

## 2021-06-10 ENCOUNTER — Other Ambulatory Visit: Payer: Self-pay

## 2021-06-10 ENCOUNTER — Emergency Department (HOSPITAL_BASED_OUTPATIENT_CLINIC_OR_DEPARTMENT_OTHER)
Admission: EM | Admit: 2021-06-10 | Discharge: 2021-06-10 | Disposition: A | Discharge status: Home / Self Care | Payer: PRIVATE HEALTH INSURANCE | Attending: Emergency Medicine | Admitting: Emergency Medicine

## 2021-06-10 ENCOUNTER — Emergency Department (HOSPITAL_BASED_OUTPATIENT_CLINIC_OR_DEPARTMENT_OTHER): Payer: PRIVATE HEALTH INSURANCE

## 2021-06-10 DIAGNOSIS — B9689 Other specified bacterial agents as the cause of diseases classified elsewhere: Secondary | ICD-10-CM | POA: Diagnosis not present

## 2021-06-10 DIAGNOSIS — Z79899 Other long term (current) drug therapy: Secondary | ICD-10-CM | POA: Diagnosis not present

## 2021-06-10 DIAGNOSIS — Z794 Long term (current) use of insulin: Secondary | ICD-10-CM | POA: Insufficient documentation

## 2021-06-10 DIAGNOSIS — E119 Type 2 diabetes mellitus without complications: Secondary | ICD-10-CM | POA: Insufficient documentation

## 2021-06-10 DIAGNOSIS — N390 Urinary tract infection, site not specified: Secondary | ICD-10-CM | POA: Insufficient documentation

## 2021-06-10 DIAGNOSIS — I10 Essential (primary) hypertension: Secondary | ICD-10-CM | POA: Diagnosis not present

## 2021-06-10 DIAGNOSIS — Z7984 Long term (current) use of oral hypoglycemic drugs: Secondary | ICD-10-CM | POA: Insufficient documentation

## 2021-06-10 DIAGNOSIS — R1084 Generalized abdominal pain: Secondary | ICD-10-CM

## 2021-06-10 DIAGNOSIS — R109 Unspecified abdominal pain: Secondary | ICD-10-CM | POA: Diagnosis present

## 2021-06-10 LAB — URINALYSIS, ROUTINE W REFLEX MICROSCOPIC
Bilirubin Urine: NEGATIVE
Glucose, UA: NEGATIVE mg/dL
Ketones, ur: NEGATIVE mg/dL
Leukocytes,Ua: NEGATIVE
Nitrite: NEGATIVE
Protein, ur: 30 mg/dL — AB
Specific Gravity, Urine: >1.03 — ABNORMAL HIGH (ref 1.005–1.030)
pH: 5 (ref 5.0–8.0)

## 2021-06-10 LAB — CBC
HCT: 33.9 % — ABNORMAL LOW (ref 36.0–46.0)
Hemoglobin: 10.3 g/dL — ABNORMAL LOW (ref 12.0–15.0)
MCH: 21.9 pg — ABNORMAL LOW (ref 26.0–34.0)
MCHC: 30.4 g/dL (ref 30.0–36.0)
MCV: 72 fL — ABNORMAL LOW (ref 80.0–100.0)
Platelets: 289 10*3/uL (ref 150–400)
RBC: 4.71 MIL/uL (ref 3.87–5.11)
RDW: 16.5 % — ABNORMAL HIGH (ref 11.5–15.5)
WBC: 7.4 10*3/uL (ref 4.0–10.5)
nRBC: 0 % (ref 0.0–0.2)

## 2021-06-10 LAB — COMPREHENSIVE METABOLIC PANEL
ALT: 17 U/L (ref 0–44)
AST: 20 U/L (ref 15–41)
Albumin: 4.1 g/dL (ref 3.5–5.0)
Alkaline Phosphatase: 71 U/L (ref 38–126)
Anion gap: 10 (ref 5–15)
BUN: 10 mg/dL (ref 6–20)
CO2: 24 mmol/L (ref 22–32)
Calcium: 9.1 mg/dL (ref 8.9–10.3)
Chloride: 106 mmol/L (ref 98–111)
Creatinine, Ser: 0.69 mg/dL (ref 0.44–1.00)
GFR, Estimated: >60 mL/min (ref >60)
Glucose, Bld: 162 mg/dL — ABNORMAL HIGH (ref 70–99)
Potassium: 4 mmol/L (ref 3.5–5.1)
Sodium: 140 mmol/L (ref 135–145)
Total Bilirubin: 0.2 mg/dL — ABNORMAL LOW (ref 0.3–1.2)
Total Protein: 7.9 g/dL (ref 6.5–8.1)

## 2021-06-10 LAB — URINALYSIS, MICROSCOPIC (REFLEX): RBC / HPF: >50 RBC/hpf (ref 0–5)

## 2021-06-10 LAB — PREGNANCY, URINE: Preg Test, Ur: NEGATIVE

## 2021-06-10 LAB — LIPASE, BLOOD: Lipase: 35 U/L (ref 11–51)

## 2021-06-10 MED ORDER — ONDANSETRON 4 MG PO TBDP: ORAL_TABLET | ORAL | 0 refills | Status: DC

## 2021-06-10 MED ORDER — ONDANSETRON HCL 4 MG/2ML IJ SOLN
4.0000 mg | Freq: Once | INTRAMUSCULAR | Status: AC
  Administered 2021-06-10: 4 mg via INTRAVENOUS
  Filled 2021-06-10: qty 2

## 2021-06-10 MED ORDER — IOHEXOL 300 MG/ML  SOLN
100.0000 mL | Freq: Once | INTRAMUSCULAR | Status: AC | PRN
  Administered 2021-06-10: 100 mL via INTRAVENOUS

## 2021-06-10 MED ORDER — DICYCLOMINE HCL 20 MG PO TABS: 20.0000 mg | ORAL_TABLET | Freq: Two times a day (BID) | ORAL | 0 refills | Status: DC

## 2021-06-10 MED ORDER — SODIUM CHLORIDE 0.9 % IV BOLUS
1000.0000 mL | Freq: Once | INTRAVENOUS | Status: AC
  Administered 2021-06-10: 1000 mL via INTRAVENOUS

## 2021-06-10 MED ORDER — CEPHALEXIN 250 MG PO CAPS
500.0000 mg | ORAL_CAPSULE | Freq: Once | ORAL | Status: AC
  Administered 2021-06-10: 500 mg via ORAL
  Filled 2021-06-10: qty 2

## 2021-06-10 MED ORDER — MORPHINE SULFATE (PF) 4 MG/ML IV SOLN
4.0000 mg | Freq: Once | INTRAVENOUS | Status: DC
Start: 2021-06-10 — End: 2021-06-11
  Filled 2021-06-10: qty 1

## 2021-06-10 MED ORDER — FAMOTIDINE IN NACL 20-0.9 MG/50ML-% IV SOLN
20.0000 mg | Freq: Once | INTRAVENOUS | Status: AC
  Administered 2021-06-10: 20 mg via INTRAVENOUS
  Filled 2021-06-10: qty 50

## 2021-06-10 MED ORDER — LIDOCAINE VISCOUS HCL 2 % MT SOLN
15.0000 mL | Freq: Once | OROMUCOSAL | Status: AC
  Administered 2021-06-10: 15 mL via ORAL
  Filled 2021-06-10: qty 15

## 2021-06-10 MED ORDER — DEXLANSOPRAZOLE 30 MG PO CPDR: 30.0000 mg | DELAYED_RELEASE_CAPSULE | Freq: Every day | ORAL | 0 refills | Status: DC

## 2021-06-10 MED ORDER — DICYCLOMINE HCL 10 MG/ML IM SOLN
20.0000 mg | Freq: Once | INTRAMUSCULAR | Status: AC
  Administered 2021-06-10: 20 mg via INTRAMUSCULAR
  Filled 2021-06-10: qty 2

## 2021-06-10 MED ORDER — CEPHALEXIN 500 MG PO CAPS: 500.0000 mg | ORAL_CAPSULE | Freq: Three times a day (TID) | ORAL | 0 refills | Status: AC

## 2021-06-10 MED ORDER — ALUM & MAG HYDROXIDE-SIMETH 200-200-20 MG/5ML PO SUSP
30.0000 mL | Freq: Once | ORAL | Status: AC
  Administered 2021-06-10: 30 mL via ORAL
  Filled 2021-06-10: qty 30

## 2021-06-10 NOTE — ED Provider Notes (Signed)
[facility] EMERGENCY DEPARTMENT Provider Note   CSN: [id number] Arrival date & time: 06/10/21  1348     History Chief Complaint  Patient presents with   Abdominal Pain    Kelly Sweeney is a 30 y.o. female.  Kelly Sweeney is a 30 y.o. female with history of hypertension and diabetes, who presents to the emergency department for evaluation of abdominal pain.  Patient reports pain has been present for 1 day.  She reports it is present throughout the abdomen and feels like it is "flared up or inflamed".  Denies burning pain.  Reports she has been afraid to eat or drink anything for fear that she will throw up but has not had any vomiting.  No diarrhea, has not had a bowel movement since pain began but feels like she may need to.  She reports she is at the end of her menstrual cycle which has been typical, denies any dysuria or urinary frequency.  No flank pain.  No vaginal discharge.  Reports all the symptoms started shortly after she ate [employer] yesterday and she has had some similar symptoms previously after eating certain foods but they have never lasted this long before.  No medications prior to arrival.  No other aggravating or alleviating factors.  The history is provided by the patient.      Past Medical History:  Diagnosis Date   Diabetes mellitus without complication (HCC)    Hypertension     Patient Active Problem List   Diagnosis Date Noted   Yeast infection involving the vagina and surrounding area 08/28/2020    History reviewed. No pertinent surgical history.   OB History   No obstetric history on file.     No family history on file.  Social History   Tobacco Use   Smoking status: Never   Smokeless tobacco: Never  Vaping Use   Vaping Use: Never used  Substance Use Topics   Alcohol use: Never   Drug use: Not Currently    Home Medications Prior to Admission medications   Medication Sig Start Date End Date Taking? Authorizing Provider   cephALEXin (KEFLEX) 500 MG capsule Take 1 capsule (500 mg total) by mouth 3 (three) times daily for 7 days. 06/10/21 06/17/21 Yes Dartha Lodge, PA-C  dicyclomine (BENTYL) 20 MG tablet Take 1 tablet (20 mg total) by mouth 2 (two) times daily. 06/10/21  Yes Dartha Lodge, PA-C  ondansetron (ZOFRAN ODT) 4 MG disintegrating tablet 4mg  ODT q4 hours prn nausea/vomit 06/10/21  Yes Domnick Chervenak N, PA-C  buPROPion [facility] SR) 100 MG 12 hr tablet Take by mouth. 06/03/20   [provider]  Dexlansoprazole (DEXILANT) 30 MG capsule Take 1 capsule (30 mg total) by mouth daily. 06/10/21   06/12/21, PA-C  escitalopram (LEXAPRO) 20 MG tablet Take by mouth. 06/03/20   [provider]  hydrOXYzine (ATARAX/VISTARIL) 25 MG tablet Take 1 tablet (25 mg total) by mouth every 8 (eight) hours as needed. 04/07/21   06/07/21, PA-C  lisinopril (ZESTRIL) 20 MG tablet Take by mouth. 11/05/19   [provider]  metFORMIN (GLUCOPHAGE-XR) 500 MG 24 hr tablet Take by mouth. 06/22/20   [provider]  metroNIDAZOLE (FLAGYL) 500 MG tablet Take 1 tablet (500 mg total) by mouth 2 (two) times daily. 02/18/21   02/20/21, PA-C  nystatin cream (MYCOSTATIN) Apply to affected area 2 times daily 08/28/20   10/28/20, FNP  rosuvastatin (CRESTOR) 20 MG tablet  Take by mouth. 02/18/20   [provider]  Semaglutide, 1 MG/DOSE, (OZEMPIC, 1 MG/DOSE,) 2 MG/1.5ML SOPN Inject into the skin. 08/03/20   [provider]    Allergies    Patient has no known allergies.  Review of Systems   Review of Systems  Constitutional:  Negative for chills and fever.  HENT: Negative.    Respiratory:  Negative for cough and shortness of breath.   Cardiovascular:  Negative for chest pain.  Gastrointestinal:  Positive for abdominal pain and constipation. Negative for diarrhea and vomiting.  Genitourinary:  Positive for vaginal bleeding (On menstrual cycle). Negative for dysuria,  flank pain, frequency, hematuria and vaginal discharge.  Musculoskeletal:  Negative for arthralgias and myalgias.  Skin:  Negative for color change and rash.  Neurological:  Negative for dizziness, syncope and light-headedness.  All other systems reviewed and are negative.  Physical Exam Updated Vital Signs BP 140/87   Pulse 85   Temp 98 F (36.7 C) (Oral)   Resp 16   Ht 5\' 9"  (1.753 m)   Wt 136.1 kg   LMP 06/05/2021   SpO2 100%   BMI 44.30 kg/m   Physical Exam Vitals and nursing note reviewed.  Constitutional:      General: She is not in acute distress.    Appearance: Normal appearance. She is well-developed. She is obese. She is not ill-appearing or diaphoretic.     Comments: Alert appears uncomfortable but in no acute distress  HENT:     Head: Normocephalic and atraumatic.  Eyes:     General:        Right eye: No discharge.        Left eye: No discharge.     Pupils: Pupils are equal, round, and reactive to light.  Cardiovascular:     Rate and Rhythm: Normal rate and regular rhythm.     Pulses: Normal pulses.     Heart sounds: Normal heart sounds.  Pulmonary:     Effort: Pulmonary effort is normal. No respiratory distress.     Breath sounds: Normal breath sounds. No wheezing or rales.     Comments: Respirations equal and unlabored, patient able to speak in full sentences, lungs clear to auscultation bilaterally  Abdominal:     General: Bowel sounds are normal. There is no distension.     Palpations: Abdomen is soft. There is no mass.     Tenderness: There is generalized abdominal tenderness. There is no guarding.     Comments: Abdomen soft, nondistended, bowel sounds present throughout, there is generalized abdominal tenderness that does not localize. No guarding or peritoneal signs  Musculoskeletal:        General: No deformity.     Cervical back: Neck supple.  Skin:    General: Skin is warm and dry.     Capillary Refill: Capillary refill takes less than 2  seconds.  Neurological:     Mental Status: She is alert and oriented to person, place, and time.     Coordination: Coordination normal.     Comments: Speech is clear, able to follow commands Moves extremities without ataxia, coordination intact  Psychiatric:        Mood and Affect: Mood normal.        Behavior: Behavior normal.    ED Results / Procedures / Treatments   Labs (all labs ordered are listed, but only abnormal results are displayed) Labs Reviewed  COMPREHENSIVE METABOLIC PANEL - Abnormal; Notable for the following components:  Result Value   Glucose, Bld 162 (*)    Total Bilirubin 0.2 (*)    All other components within normal limits  CBC - Abnormal; Notable for the following components:   Hemoglobin 10.3 (*)    HCT 33.9 (*)    MCV 72.0 (*)    MCH 21.9 (*)    RDW 16.5 (*)    All other components within normal limits  URINALYSIS, ROUTINE W REFLEX MICROSCOPIC - Abnormal; Notable for the following components:   Specific Gravity, Urine >1.030 (*)    Hgb urine dipstick LARGE (*)    Protein, ur 30 (*)    All other components within normal limits  URINALYSIS, MICROSCOPIC (REFLEX) - Abnormal; Notable for the following components:   Bacteria, UA MANY (*)    All other components within normal limits  URINE CULTURE  LIPASE, BLOOD  PREGNANCY, URINE    EKG None  Radiology CT ABDOMEN PELVIS W CONTRAST  Result Date: 06/10/2021 CLINICAL DATA:  Abdominal pain.  Acute nonlocalized EXAM: CT ABDOMEN AND PELVIS WITH CONTRAST TECHNIQUE: Multidetector CT imaging of the abdomen and pelvis was performed using the standard protocol following bolus administration of intravenous contrast. CONTRAST:  100mL OMNIPAQUE IOHEXOL 300 MG/ML  SOLN COMPARISON:  None. FINDINGS: Lower chest: Lung bases are clear. Hepatobiliary: No focal hepatic lesion. No biliary duct dilatation. Common bile duct is normal. Pancreas: Pancreas is normal. No ductal dilatation. No pancreatic inflammation. Spleen:  Normal spleen Adrenals/urinary tract: Adrenal glands and kidneys are normal. The ureters and bladder normal. Stomach/Bowel: Stomach, small bowel, appendix, and cecum are normal. The colon and rectosigmoid colon are normal. Vascular/Lymphatic: Abdominal aorta is normal caliber. No periportal or retroperitoneal adenopathy. No pelvic adenopathy. Reproductive: Uterus and ovaries are normal. There is a rounded mass along the anterior wall of the uterus measuring 4.1 by 3.0 cm in sagittal dimension (image 68/6). No fat plane between this lesion and the uterus consistent with a exophytic leiomyoma. Other: No free fluid. Musculoskeletal: No aggressive osseous lesion. IMPRESSION: 1. No acute findings in the abdomen pelvis. 2. Normal gallbladder and appendix. 3. No obstructive uropathy. 4. 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 Electronically Signed   By: Genevive BiStewart  Edmunds M.D.   On: 06/10/2021 20:04    Procedures Procedures   Medications Ordered in ED Medications  morphine 4 MG/ML injection 4 mg (0 mg Intravenous Hold 06/10/21 1748)  cephALEXin (KEFLEX) capsule 500 mg (has no administration in time range)  sodium chloride 0.9 % bolus 1,000 mL (0 mLs Intravenous Stopped 06/10/21 1739)  ondansetron (ZOFRAN) injection 4 mg (4 mg Intravenous Given 06/10/21 1548)  famotidine (PEPCID) IVPB 20 mg premix (0 mg Intravenous Stopped 06/10/21 1624)  dicyclomine (BENTYL) injection 20 mg (20 mg Intramuscular Given 06/10/21 1553)  alum & mag hydroxide-simeth (MAALOX/MYLANTA) 200-200-20 MG/5ML suspension 30 mL (30 mLs Oral Given 06/10/21 1640)    And  lidocaine (XYLOCAINE) 2 % viscous mouth solution 15 mL (15 mLs Oral Given 06/10/21 1640)  iohexol (OMNIPAQUE) 300 MG/ML solution 100 mL (100 mLs Intravenous Contrast Given 06/10/21 1951)    ED Course  I have reviewed the triage vital signs and the nursing notes.  Pertinent labs & imaging results that were available during my care of the patient were reviewed  by me and considered in my medical decision making (see chart for details).    MDM Rules/Calculators/A&P  Patient presents to the ED with complaints of abdominal pain. Patient nontoxic appearing, in no apparent distress, vitals WNL. On exam patient tender to palpation throughout the abdomen, no peritoneal signs. Will evaluate with labs. Analgesics, anti-emetics, and fluids administered.   Additional history obtained:  Additional history obtained from chart review & nursing note review.   Lab Tests:  I Ordered, reviewed, and interpreted labs, which included:  CBC: No leukocytosis, stable hemoglobin CMP: Glucose 162, no other electrolyte derangements, normal renal and liver function Lipase: WNL UA: Many bacteria, hematuria noted as well, no leukocytes or nitrites, culture pending Preg test: Negative  Despite reassuring lab work patient has continued to have pain with mild improvement with medication, on repeat exam she seems to have some more focal tenderness in the right lower quadrant, will proceed with CT.  Patient does not have a ride home, limited in available pain medications for additional treatment.  Imaging Studies ordered:  I ordered imaging studies which included CT abdomen pelvis, I independently reviewed, formal radiology impression shows:  No acute findings in the abdomen or pelvis, normal gallbladder and appendix.  There is a uterine fibroid noted on the anterior margin of the uterus noted incidentally  ED Course:   Patient is still having some pain but is overall improved, did have 1 episode of emesis after GI cocktail, but no further emesis and now tolerating p.o.  On repeat abdominal exam patient remains without peritoneal signs, low suspicion for cholecystitis, pancreatitis, diverticulitis, appendicitis, bowel obstruction/perforation,  PID, ectopic pregnancy, or other acute surgical process. Patient tolerating PO in the emergency department. Will  discharge home with supportive measures.  Given many bacteria noted on urinalysis we will also treat for potential UTI, culture is pending.  I discussed results, treatment plan, need for PCP follow-up, and return precautions with the patient. Provided opportunity for questions, patient confirmed understanding and is in agreement with plan.    Portions of this note were generated with Scientist, clinical (histocompatibility and immunogenetics). Dictation errors may occur despite best attempts at proofreading.   Final Clinical Impression(s) / ED Diagnoses Final diagnoses:  Generalized abdominal pain  Acute UTI    Rx / DC Orders ED Discharge Orders          Ordered    Dexlansoprazole (DEXILANT) 30 MG capsule  Daily        06/10/21 2149    ondansetron (ZOFRAN ODT) 4 MG disintegrating tablet        06/10/21 2149    dicyclomine (BENTYL) 20 MG tablet  2 times daily        06/10/21 2149    cephALEXin (KEFLEX) 500 MG capsule  3 times daily        06/10/21 2149             Legrand Rams 06/10/21 2151    Linwood Dibbles, MD 06/12/21 [phone]

## 2021-06-10 NOTE — Discharge Instructions (Addendum)
Take Keflex for possible UTI, you do have a urine culture pending and they will call you if a different antibiotic is more appropriate.  Take Dexilant once daily in the morning before any food to help with reducing acid production.  Use Zofran as needed for nausea and vomiting.  Bentyl as needed for abdominal pain.  Follow-up with your primary doctor.  Return for new or worsening symptoms.

## 2021-06-10 NOTE — ED Notes (Signed)
Pt vomiting. EDPA made aware.

## 2021-06-10 NOTE — ED Triage Notes (Signed)
C/o diffuse abd pain x 1 day , denies n/v/d  

## 2021-06-12 LAB — URINE CULTURE

## 2021-09-01 ENCOUNTER — Emergency Department (HOSPITAL_BASED_OUTPATIENT_CLINIC_OR_DEPARTMENT_OTHER): Payer: PRIVATE HEALTH INSURANCE

## 2021-09-01 ENCOUNTER — Encounter (HOSPITAL_BASED_OUTPATIENT_CLINIC_OR_DEPARTMENT_OTHER): Payer: Self-pay | Admitting: Emergency Medicine

## 2021-09-01 ENCOUNTER — Emergency Department (HOSPITAL_BASED_OUTPATIENT_CLINIC_OR_DEPARTMENT_OTHER)
Admission: EM | Admit: 2021-09-01 | Discharge: 2021-09-02 | Disposition: A | Discharge status: Home / Self Care | Payer: PRIVATE HEALTH INSURANCE | Attending: Emergency Medicine | Admitting: Emergency Medicine

## 2021-09-01 ENCOUNTER — Other Ambulatory Visit: Payer: Self-pay

## 2021-09-01 DIAGNOSIS — Z79899 Other long term (current) drug therapy: Secondary | ICD-10-CM | POA: Diagnosis not present

## 2021-09-01 DIAGNOSIS — R5383 Other fatigue: Secondary | ICD-10-CM | POA: Diagnosis not present

## 2021-09-01 DIAGNOSIS — R42 Dizziness and giddiness: Secondary | ICD-10-CM | POA: Insufficient documentation

## 2021-09-01 DIAGNOSIS — R110 Nausea: Secondary | ICD-10-CM | POA: Diagnosis not present

## 2021-09-01 DIAGNOSIS — R0602 Shortness of breath: Secondary | ICD-10-CM | POA: Diagnosis not present

## 2021-09-01 DIAGNOSIS — Z8616 Personal history of COVID-19: Secondary | ICD-10-CM | POA: Diagnosis not present

## 2021-09-01 DIAGNOSIS — R197 Diarrhea, unspecified: Secondary | ICD-10-CM | POA: Insufficient documentation

## 2021-09-01 DIAGNOSIS — R55 Syncope and collapse: Secondary | ICD-10-CM | POA: Diagnosis not present

## 2021-09-01 DIAGNOSIS — R059 Cough, unspecified: Secondary | ICD-10-CM | POA: Insufficient documentation

## 2021-09-01 DIAGNOSIS — Z7984 Long term (current) use of oral hypoglycemic drugs: Secondary | ICD-10-CM | POA: Insufficient documentation

## 2021-09-01 DIAGNOSIS — R630 Anorexia: Secondary | ICD-10-CM | POA: Insufficient documentation

## 2021-09-01 DIAGNOSIS — E119 Type 2 diabetes mellitus without complications: Secondary | ICD-10-CM | POA: Diagnosis not present

## 2021-09-01 DIAGNOSIS — I10 Essential (primary) hypertension: Secondary | ICD-10-CM | POA: Insufficient documentation

## 2021-09-01 LAB — COMPREHENSIVE METABOLIC PANEL
ALT: 17 U/L (ref 0–44)
AST: 14 U/L — ABNORMAL LOW (ref 15–41)
Albumin: 4.1 g/dL (ref 3.5–5.0)
Alkaline Phosphatase: 79 U/L (ref 38–126)
Anion gap: 8 (ref 5–15)
BUN: 10 mg/dL (ref 6–20)
CO2: 21 mmol/L — ABNORMAL LOW (ref 22–32)
Calcium: 9.3 mg/dL (ref 8.9–10.3)
Chloride: 106 mmol/L (ref 98–111)
Creatinine, Ser: 0.68 mg/dL (ref 0.44–1.00)
GFR, Estimated: >60 mL/min (ref >60)
Glucose, Bld: 164 mg/dL — ABNORMAL HIGH (ref 70–99)
Potassium: 4.2 mmol/L (ref 3.5–5.1)
Sodium: 135 mmol/L (ref 135–145)
Total Bilirubin: 0.3 mg/dL (ref 0.3–1.2)
Total Protein: 7.9 g/dL (ref 6.5–8.1)

## 2021-09-01 LAB — CBC
HCT: 34.7 % — ABNORMAL LOW (ref 36.0–46.0)
Hemoglobin: 10.6 g/dL — ABNORMAL LOW (ref 12.0–15.0)
MCH: 22.3 pg — ABNORMAL LOW (ref 26.0–34.0)
MCHC: 30.5 g/dL (ref 30.0–36.0)
MCV: 73.1 fL — ABNORMAL LOW (ref 80.0–100.0)
Platelets: 327 10*3/uL (ref 150–400)
RBC: 4.75 MIL/uL (ref 3.87–5.11)
RDW: 19.2 % — ABNORMAL HIGH (ref 11.5–15.5)
WBC: 6.7 10*3/uL (ref 4.0–10.5)
nRBC: 0 % (ref 0.0–0.2)

## 2021-09-01 LAB — LIPASE, BLOOD: Lipase: 45 U/L (ref 11–51)

## 2021-09-01 MED ORDER — DIPHENHYDRAMINE HCL 50 MG/ML IJ SOLN
25.0000 mg | Freq: Once | INTRAMUSCULAR | Status: AC
  Administered 2021-09-01: 25 mg via INTRAVENOUS
  Filled 2021-09-01: qty 1

## 2021-09-01 MED ORDER — LACTATED RINGERS IV BOLUS
1000.0000 mL | Freq: Once | INTRAVENOUS | Status: AC
  Administered 2021-09-02: 1000 mL via INTRAVENOUS

## 2021-09-01 MED ORDER — LACTATED RINGERS IV BOLUS
1000.0000 mL | Freq: Once | INTRAVENOUS | Status: AC
  Administered 2021-09-01: 1000 mL via INTRAVENOUS

## 2021-09-01 MED ORDER — METOCLOPRAMIDE HCL 5 MG/ML IJ SOLN
10.0000 mg | Freq: Once | INTRAMUSCULAR | Status: AC
  Administered 2021-09-01: 10 mg via INTRAVENOUS
  Filled 2021-09-01: qty 2

## 2021-09-01 NOTE — ED Provider Notes (Signed)
[facility] EMERGENCY DEPARTMENT Provider Note   CSN: [id number] Arrival date & time: 09/01/21  1853     History Chief Complaint  Patient presents with   pre syncope   Nausea    Kelly Sweeney is a 30 y.o. female.  HPI Patient presents for fatigue, lightheadedness, nausea, and near syncopal episodes throughout the day today.  She denies any chest discomfort or shortness of breath.  She states that she has had minimal p.o. intake over the past 3 days.  She attributes this to loss of appetite.  She has not had vomiting.  She has had diarrhea.  She states that this is normal for her and attributes it to her metformin.  She has not had any worsened diarrhea.  Patient had COVID-19 infection a month ago.  Since that time, she has had persistent symptoms of decreased appetite, and persistent dry cough.  She has not had any known recent fevers.  She denies any chills or diaphoresis.  Prior to arrival in the ED, she did go to urgent care.  At that time, urine was checked.  Results were notable for increased specific gravity, consistent with dehydration.  No other work-up or treatments were given at urgent care.    Past Medical History:  Diagnosis Date   Diabetes mellitus without complication (HCC)    Hypertension     Patient Active Problem List   Diagnosis Date Noted   Yeast infection involving the vagina and surrounding area 08/28/2020    History reviewed. No pertinent surgical history.   OB History   No obstetric history on file.     No family history on file.  Social History   Tobacco Use   Smoking status: Never   Smokeless tobacco: Never  Vaping Use   Vaping Use: Never used  Substance Use Topics   Alcohol use: Never   Drug use: Not Currently    Home Medications Prior to Admission medications   Medication Sig Start Date End Date Taking? Authorizing Provider  buPROPion (WELLBUTRIN SR) 100 MG 12 hr tablet Take by mouth. 06/03/20   [provider]   Dexlansoprazole (DEXILANT) 30 MG capsule Take 1 capsule (30 mg total) by mouth daily. 06/10/21   Dartha Lodge, PA-C  dicyclomine (BENTYL) 20 MG tablet Take 1 tablet (20 mg total) by mouth 2 (two) times daily. 06/10/21   Dartha Lodge, PA-C  escitalopram (LEXAPRO) 20 MG tablet Take by mouth. 06/03/20   [provider]  hydrOXYzine (ATARAX/VISTARIL) 25 MG tablet Take 1 tablet (25 mg total) by mouth every 8 (eight) hours as needed. 04/07/21   Haskel Schroeder, PA-C  lisinopril (ZESTRIL) 20 MG tablet Take by mouth. 11/05/19   [provider]  metFORMIN (GLUCOPHAGE-XR) 500 MG 24 hr tablet Take by mouth. 06/22/20   [provider]  metroNIDAZOLE (FLAGYL) 500 MG tablet Take 1 tablet (500 mg total) by mouth 2 (two) times daily. 02/18/21   Cristina Gong, PA-C  nystatin cream (MYCOSTATIN) Apply to affected area 2 times daily 08/28/20   Bing Neighbors, FNP  ondansetron (ZOFRAN ODT) 4 MG disintegrating tablet 4mg  ODT q4 hours prn nausea/vomit 06/10/21   06/12/21, PA-C  rosuvastatin (CRESTOR) 20 MG tablet Take by mouth. 02/18/20   [provider]  Semaglutide, 1 MG/DOSE, (OZEMPIC, 1 MG/DOSE,) 2 MG/1.5ML SOPN Inject into the skin. 08/03/20   [provider]    Allergies    Patient has no known allergies.  Review of Systems  Review of Systems  Constitutional:  Positive for activity change, appetite change and fatigue. Negative for chills, diaphoresis and fever.  HENT:  Negative for congestion, ear pain, sore throat and trouble swallowing.   Eyes:  Negative for photophobia, pain and visual disturbance.  Respiratory:  Negative for cough, chest tightness, shortness of breath and wheezing.   Cardiovascular:  Negative for chest pain, palpitations and leg swelling.  Gastrointestinal:  Positive for diarrhea and nausea. Negative for abdominal distention, abdominal pain, blood in stool and vomiting.  Genitourinary:  Negative for dysuria, flank pain,  hematuria, pelvic pain, vaginal bleeding and vaginal discharge.  Musculoskeletal:  Negative for arthralgias, back pain, joint swelling, myalgias and neck pain.  Skin:  Negative for color change, rash and wound.  Neurological:  Positive for light-headedness. Negative for seizures, syncope, facial asymmetry, speech difficulty and numbness.  Hematological:  Does not bruise/bleed easily.  Psychiatric/Behavioral:  Negative for confusion and decreased concentration.   All other systems reviewed and are negative.  Physical Exam Updated Vital Signs BP 126/74   Pulse 98   Temp 98.4 F (36.9 C) (Oral)   Resp 16   Ht 5\' 9"  (1.753 m)   Wt (!) 140.2 kg   LMP 08/05/2021   SpO2 100%   BMI 45.63 kg/m   Physical Exam Vitals and nursing note reviewed.  Constitutional:      General: She is not in acute distress.    Appearance: Normal appearance. She is well-developed. She is not ill-appearing, toxic-appearing or diaphoretic.  HENT:     Head: Normocephalic and atraumatic.     Right Ear: External ear normal.     Left Ear: External ear normal.     Nose: Nose normal.     Mouth/Throat:     Mouth: Mucous membranes are moist.     Pharynx: Oropharynx is clear.  Eyes:     General: No scleral icterus.    Extraocular Movements: Extraocular movements intact.     Conjunctiva/sclera: Conjunctivae normal.  Cardiovascular:     Rate and Rhythm: Normal rate and regular rhythm.     Heart sounds: No murmur heard. Pulmonary:     Effort: Pulmonary effort is normal. No respiratory distress.     Breath sounds: Normal breath sounds. No wheezing or rales.  Abdominal:     Palpations: Abdomen is soft.     Tenderness: There is no abdominal tenderness. There is no right CVA tenderness or left CVA tenderness.  Musculoskeletal:        General: No swelling or deformity. Normal range of motion.     Cervical back: Normal range of motion and neck supple. No tenderness.     Right lower leg: No edema.     Left lower leg:  No edema.  Skin:    General: Skin is warm and dry.     Coloration: Skin is not jaundiced or pale.  Neurological:     General: No focal deficit present.     Mental Status: She is alert and oriented to person, place, and time.     Cranial Nerves: No cranial nerve deficit.     Sensory: No sensory deficit.     Motor: No weakness.     Coordination: Coordination normal.  Psychiatric:        Mood and Affect: Mood normal.        Behavior: Behavior normal.        Thought Content: Thought content normal.    ED Results / Procedures / Treatments  Labs (all labs ordered are listed, but only abnormal results are displayed) Labs Reviewed  COMPREHENSIVE METABOLIC PANEL - Abnormal; Notable for the following components:      Result Value   CO2 21 (*)    Glucose, Bld 164 (*)    AST 14 (*)    All other components within normal limits  CBC - Abnormal; Notable for the following components:   Hemoglobin 10.6 (*)    HCT 34.7 (*)    MCV 73.1 (*)    MCH 22.3 (*)    RDW 19.2 (*)    All other components within normal limits  URINALYSIS, ROUTINE W REFLEX MICROSCOPIC - Abnormal; Notable for the following components:   APPearance CLOUDY (*)    Ketones, ur 15 (*)    Leukocytes,Ua SMALL (*)    All other components within normal limits  URINALYSIS, MICROSCOPIC (REFLEX) - Abnormal; Notable for the following components:   Bacteria, UA MANY (*)    All other components within normal limits  LIPASE, BLOOD  PREGNANCY, URINE    EKG EKG Interpretation  Date/Time:  Thursday September 01 2021 20:47:44 EDT Ventricular Rate:  92 PR Interval:  168 QRS Duration: 86 QT Interval:  335 QTC Calculation: 415 R Axis:   46 Text Interpretation: Sinus rhythm Confirmed by Gloris Manchester ([id number]) on 09/01/2021 8:50:01 PM  Radiology CT Angio Chest PE W and/or Wo Contrast  Result Date: 09/02/2021 CLINICAL DATA:  Cough and near syncopal episode, history of recent COVID infection EXAM: CT ANGIOGRAPHY CHEST WITH CONTRAST  TECHNIQUE: Multidetector CT imaging of the chest was performed using the standard protocol during bolus administration of intravenous contrast. Multiplanar CT image reconstructions and MIPs were obtained to evaluate the vascular anatomy. CONTRAST:  OMNIPAQUE IOHEXOL 350 MG/ML SOLN COMPARISON:  Chest x-ray from the previous day. FINDINGS: Cardiovascular: Thoracic aorta and its branches are well visualized without atherosclerotic calcifications. Heart is not significantly enlarged. The pulmonary artery shows no findings to suggest pulmonary embolism. Normal branching pattern is seen. Mediastinum/Nodes: Thoracic inlet is within normal limits. No sizable hilar or mediastinal adenopathy is noted. The esophagus as visualized is within normal limits. Lungs/Pleura: Lungs are well aerated bilaterally. No focal infiltrate or sizable effusion is seen. No parenchymal nodule is noted. Upper Abdomen: Visualized upper abdomen is unremarkable. Musculoskeletal: No rib abnormality is noted. No acute bony abnormality is seen. Review of the MIP images confirms the above findings. IMPRESSION: No evidence of pulmonary emboli. No other focal abnormality seen. Electronically Signed   By: Alcide Clever M.D.   On: 09/02/2021 01:09   DG Chest Portable 1 View  Result Date: 09/01/2021 CLINICAL DATA:  Cough. EXAM: PORTABLE CHEST 1 VIEW COMPARISON:  None. FINDINGS: The heart size and mediastinal contours are within normal limits. Both lungs are clear. The visualized skeletal structures are unremarkable. IMPRESSION: No active disease. Electronically Signed   By: Darliss Cheney M.D.   On: 09/01/2021 20:47    Procedures Procedures   Medications Ordered in ED Medications  lactated ringers bolus 1,000 mL ( Intravenous Stopped 09/02/21 0135)  metoCLOPramide (REGLAN) injection 10 mg (10 mg Intravenous Given 09/01/21 2307)  diphenhydrAMINE (BENADRYL) injection 25 mg (25 mg Intravenous Given 09/01/21 2308)  lactated ringers bolus 1,000 mL  (0 mLs Intravenous Stopped 09/02/21 0007)  albuterol (VENTOLIN HFA) 108 (90 Base) MCG/ACT inhaler 4 puff (4 puffs Inhalation Given 09/02/21 0025)  iohexol (OMNIPAQUE) 350 MG/ML injection 100 mL (100 mLs Intravenous Contrast Given 09/02/21 0043)    ED Course  I  have reviewed the triage vital signs and the nursing notes.  Pertinent labs & imaging results that were available during my care of the patient were reviewed by me and considered in my medical decision making (see chart for details).    MDM Rules/Calculators/A&P                          Patient presents for fatigue, lightheadedness, and feelings of near syncope today.  She has had ongoing symptoms of fatigue and chronic cough since her COVID infection last month.  She has had poor p.o. intake which has worsened over the past 3 days.  She states that she has ate and drank very little over that time.  Vital signs are normal upon arrival.  She is well-appearing on exam.  Lungs are clear to auscultation.  Dry cough is present during exam.  Abdomen is soft and nontender.  Presentation is consistent with dehydration.  Will obtain labs and give IV fluids.  Additional medications given for symptomatic relief of nausea.  ED staff had difficult time obtaining IV access.  I was able to obtain peripheral IV under ultrasound guidance.  Patient had persistent cough while in the ED.  Albuterol given for symptomatic relief.  Given her constellation of symptoms, CTA of chest was ordered to rule out PE or occult pneumonia.  Care of patient was signed out to oncoming ED provider. Final Clinical Impression(s) / ED Diagnoses Final diagnoses:  Near syncope    Rx / DC Orders ED Discharge Orders     None        Gloris Manchester, MD 09/02/21 1308

## 2021-09-01 NOTE — ED Notes (Signed)
Unsuccessful IV start attempts x4 ( 2 by Boneta Lucks RT, 2 by Gelene Mink RN). Dr Durwin Nora informed.

## 2021-09-01 NOTE — ED Notes (Signed)
Attempted ultrasound IV x 2 without success not able to advance catheter into lumen of vein. Able to draw labs. Patient tolerated well.

## 2021-09-01 NOTE — ED Notes (Signed)
Portable X-ray T bedside

## 2021-09-01 NOTE — ED Notes (Signed)
Pt is aware she needs a urine sample, she has a urine cup. She does not think she can go at this moment.

## 2021-09-01 NOTE — ED Triage Notes (Signed)
Patient reports nausea, feeling like she is gonna pass out and generalized weakness.  Also has a cough.  Reports that is lingering from covid.  Was positive 09/14.

## 2021-09-02 ENCOUNTER — Emergency Department (HOSPITAL_BASED_OUTPATIENT_CLINIC_OR_DEPARTMENT_OTHER): Payer: PRIVATE HEALTH INSURANCE

## 2021-09-02 LAB — URINALYSIS, ROUTINE W REFLEX MICROSCOPIC
Bilirubin Urine: NEGATIVE
Glucose, UA: NEGATIVE mg/dL
Hgb urine dipstick: NEGATIVE
Ketones, ur: 15 mg/dL — AB
Nitrite: NEGATIVE
Protein, ur: NEGATIVE mg/dL
Specific Gravity, Urine: 1.03 (ref 1.005–1.030)
pH: 5.5 (ref 5.0–8.0)

## 2021-09-02 LAB — URINALYSIS, MICROSCOPIC (REFLEX)

## 2021-09-02 LAB — PREGNANCY, URINE: Preg Test, Ur: NEGATIVE

## 2021-09-02 MED ORDER — ALBUTEROL SULFATE HFA 108 (90 BASE) MCG/ACT IN AERS
4.0000 | INHALATION_SPRAY | Freq: Once | RESPIRATORY_TRACT | Status: AC
  Administered 2021-09-02: 4 via RESPIRATORY_TRACT
  Filled 2021-09-02: qty 6.7

## 2021-09-02 MED ORDER — IOHEXOL 350 MG/ML SOLN
100.0000 mL | Freq: Once | INTRAVENOUS | Status: AC | PRN
  Administered 2021-09-02: 100 mL via INTRAVENOUS

## 2021-09-02 NOTE — ED Notes (Signed)
Report given and care transferred to [facility]

## 2022-01-04 ENCOUNTER — Emergency Department (HOSPITAL_BASED_OUTPATIENT_CLINIC_OR_DEPARTMENT_OTHER): Payer: PRIVATE HEALTH INSURANCE

## 2022-01-04 ENCOUNTER — Encounter (HOSPITAL_BASED_OUTPATIENT_CLINIC_OR_DEPARTMENT_OTHER): Payer: Self-pay | Admitting: *Deleted

## 2022-01-04 ENCOUNTER — Emergency Department (HOSPITAL_BASED_OUTPATIENT_CLINIC_OR_DEPARTMENT_OTHER)
Admission: EM | Admit: 2022-01-04 | Discharge: 2022-01-04 | Disposition: A | Discharge status: Home / Self Care | Payer: PRIVATE HEALTH INSURANCE | Attending: Emergency Medicine | Admitting: Emergency Medicine

## 2022-01-04 ENCOUNTER — Other Ambulatory Visit: Payer: Self-pay

## 2022-01-04 DIAGNOSIS — M545 Low back pain, unspecified: Secondary | ICD-10-CM | POA: Diagnosis present

## 2022-01-04 DIAGNOSIS — Z7984 Long term (current) use of oral hypoglycemic drugs: Secondary | ICD-10-CM | POA: Diagnosis not present

## 2022-01-04 DIAGNOSIS — R319 Hematuria, unspecified: Secondary | ICD-10-CM | POA: Insufficient documentation

## 2022-01-04 LAB — URINALYSIS, MICROSCOPIC (REFLEX): WBC, UA: NONE SEEN WBC/hpf (ref 0–5)

## 2022-01-04 LAB — URINALYSIS, ROUTINE W REFLEX MICROSCOPIC
Bilirubin Urine: NEGATIVE
Glucose, UA: >=500 mg/dL — AB
Hgb urine dipstick: NEGATIVE
Ketones, ur: 40 mg/dL — AB
Leukocytes,Ua: NEGATIVE
Nitrite: NEGATIVE
Protein, ur: NEGATIVE mg/dL
Specific Gravity, Urine: >=1.03 (ref 1.005–1.030)
pH: 5.5 (ref 5.0–8.0)

## 2022-01-04 LAB — PREGNANCY, URINE: Preg Test, Ur: NEGATIVE

## 2022-01-04 MED ORDER — METHOCARBAMOL 500 MG PO TABS: 500.0000 mg | ORAL_TABLET | Freq: Three times a day (TID) | ORAL | 0 refills | Status: DC | PRN

## 2022-01-04 MED ORDER — IBUPROFEN 800 MG PO TABS: 800.0000 mg | ORAL_TABLET | Freq: Four times a day (QID) | ORAL | 0 refills | Status: DC | PRN

## 2022-01-04 NOTE — ED Provider Notes (Signed)
[place] EMERGENCY DEPARTMENT Provider Note   CSN: [id number] Arrival date & time: 01/04/22  1737     History  Chief Complaint  Patient presents with   Flank Pain    Kelly Sweeney is a 31 y.o. female.  Patient has been experiencing intermittent bilateral lower back pain for 5 days.  She saw her doctor for this and had a urinalysis which showed hematuria.  She reports a urine culture ultimately was negative.  When the culture came back negative, her doctor sent her to the ER to rule out kidney stone.  Pain does not radiate to the legs.  No change in bowel or bladder function.  No vaginal discharge.      Home Medications Prior to Admission medications   Medication Sig Start Date End Date Taking? Authorizing Provider  buPROPion (WELLBUTRIN SR) 100 MG 12 hr tablet Take by mouth. 06/03/20   [provider]  Dexlansoprazole (DEXILANT) 30 MG capsule Take 1 capsule (30 mg total) by mouth daily. 06/10/21   Jacqlyn Larsen, PA-C  dicyclomine (BENTYL) 20 MG tablet Take 1 tablet (20 mg total) by mouth 2 (two) times daily. 06/10/21   Jacqlyn Larsen, PA-C  escitalopram (LEXAPRO) 20 MG tablet Take by mouth. 06/03/20   [provider]  hydrOXYzine (ATARAX/VISTARIL) 25 MG tablet Take 1 tablet (25 mg total) by mouth every 8 (eight) hours as needed. 04/07/21   Loni Beckwith, PA-C  lisinopril (ZESTRIL) 20 MG tablet Take by mouth. 11/05/19   [provider]  metFORMIN (GLUCOPHAGE-XR) 500 MG 24 hr tablet Take by mouth. 06/22/20   [provider]  metroNIDAZOLE (FLAGYL) 500 MG tablet Take 1 tablet (500 mg total) by mouth 2 (two) times daily. 02/18/21   Lorin Glass, PA-C  nystatin cream (MYCOSTATIN) Apply to affected area 2 times daily 08/28/20   Scot Jun, FNP  ondansetron [facility] ODT) 4 MG disintegrating tablet 4mg  ODT q4 hours prn nausea/vomit 06/10/21   Jacqlyn Larsen, PA-C  rosuvastatin (CRESTOR) 20 MG tablet Take by mouth. 02/18/20    [provider]  Semaglutide, 1 MG/DOSE, (OZEMPIC, 1 MG/DOSE,) 2 MG/1.5ML SOPN Inject into the skin. 08/03/20   [provider]      Allergies    Patient has no known allergies.    Review of Systems   Review of Systems  Musculoskeletal:  Positive for back pain.   Physical Exam Updated Vital Signs BP 118/78 (BP Location: Left Arm)    Pulse 93    Temp 99 F (37.2 C) (Oral)    Resp 16    Ht 5\' 8"  (1.727 m)    Wt (!) 140.6 kg    LMP 12/13/2021    SpO2 99%    BMI 47.14 kg/m  Physical Exam Vitals and nursing note reviewed.  Constitutional:      General: She is not in acute distress.    Appearance: She is well-developed.  HENT:     Head: Normocephalic and atraumatic.  Eyes:     Conjunctiva/sclera: Conjunctivae normal.  Cardiovascular:     Rate and Rhythm: Normal rate and regular rhythm.     Heart sounds: No murmur heard. Pulmonary:     Effort: Pulmonary effort is normal. No respiratory distress.     Breath sounds: Normal breath sounds.  Abdominal:     Palpations: Abdomen is soft.     Tenderness: There is no abdominal tenderness.  Musculoskeletal:        General: No swelling.  Cervical back: Neck supple.     Thoracic back: Normal.     Lumbar back: Tenderness present. Negative right straight leg raise test and negative left straight leg raise test.       Back:  Skin:    General: Skin is warm and dry.     Capillary Refill: Capillary refill takes less than 2 seconds.  Neurological:     Mental Status: She is alert.  Psychiatric:        Mood and Affect: Mood normal.    ED Results / Procedures / Treatments   Labs (all labs ordered are listed, but only abnormal results are displayed) Labs Reviewed  URINALYSIS, ROUTINE W REFLEX MICROSCOPIC - Abnormal; Notable for the following components:      Result Value   Glucose, UA >=500 (*)    Ketones, ur 40 (*)    All other components within normal limits  URINALYSIS, MICROSCOPIC (REFLEX) - Abnormal; Notable for  the following components:   Bacteria, UA RARE (*)    All other components within normal limits  PREGNANCY, URINE    EKG None  Radiology CT RENAL STONE STUDY  Result Date: 01/04/2022 CLINICAL DATA:  Bilateral flank pain and hematuria for 5 days. Kidney stones suspected. EXAM: CT ABDOMEN AND PELVIS WITHOUT CONTRAST TECHNIQUE: Multidetector CT imaging of the abdomen and pelvis was performed following the standard protocol without IV contrast. RADIATION DOSE REDUCTION: This exam was performed according to the departmental dose-optimization program which includes automated exposure control, adjustment of the mA and/or kV according to patient size and/or use of iterative reconstruction technique. COMPARISON:  CT abdomen and pelvis 06/10/2021.  CT chest 09/02/2021 FINDINGS: Lower chest: The lung bases are clear. Hepatobiliary: No focal liver abnormality is seen. No gallstones, gallbladder wall thickening, or biliary dilatation. Pancreas: Unremarkable. No pancreatic ductal dilatation or surrounding inflammatory changes. Spleen: Normal in size without focal abnormality. Adrenals/Urinary Tract: Adrenal glands are unremarkable. Kidneys are normal, without renal calculi, focal lesion, or hydronephrosis. Bladder is unremarkable. Stomach/Bowel: Stomach is within normal limits. Appendix appears normal. No evidence of bowel wall thickening, distention, or inflammatory changes. Vascular/Lymphatic: No significant vascular findings are present. No enlarged abdominal or pelvic lymph nodes. Reproductive: Uterus and bilateral adnexa are unremarkable. Minimal free fluid in the pelvis is likely physiologic. Other: No free air in the abdomen. Abdominal wall musculature appears intact. Musculoskeletal: No acute or significant osseous findings. IMPRESSION: No renal or ureteral stone or obstruction. No acute process demonstrated in the abdomen or pelvis. Electronically Signed   By: Lucienne Capers M.D.   On: 01/04/2022 21:57     Procedures Procedures    Medications Ordered in ED Medications - No data to display  ED Course/ Medical Decision Making/ A&P                           Medical Decision Making Amount and/or Complexity of Data Reviewed Labs: ordered. Radiology: ordered.   Patient sent to the emergency department to rule out kidney stone.  Patient experiencing intermittent bilateral lower back pain for 5 days.  Patient reports that she sits all day at work and thinks this might be related.  She was worked up for possible UTI which was ultimately found to be negative by her primary doctor.  She did have blood in her urine, not currently menstruating.  Urinalysis here in the department, however, is normal.  No hematuria, negative hemoglobin.  No signs of infection.  Discussed with patient  likelihood of this being musculoskeletal in nature but offered CT renal stone.  Discussed risks of radiation exposure.  Patient agrees to CT scan.  Scan was performed that does not show any acute pathology including no renal or ureteral stones.  Treat for musculoskeletal back pain.        Final Clinical Impression(s) / ED Diagnoses Final diagnoses:  Acute bilateral low back pain without sciatica    Rx / DC Orders ED Discharge Orders     None         Orpah Greek, MD 01/04/22 2205

## 2022-01-04 NOTE — ED Notes (Signed)
Pt was sent her to eval for kidney stone, bilateral flank pain

## 2022-01-04 NOTE — ED Triage Notes (Addendum)
C/o bil flank pain and hematuria x 5 days , sent here by PMD for eval kidney stone

## 2022-07-16 ENCOUNTER — Encounter (HOSPITAL_BASED_OUTPATIENT_CLINIC_OR_DEPARTMENT_OTHER): Payer: Self-pay | Admitting: Emergency Medicine

## 2022-07-16 ENCOUNTER — Other Ambulatory Visit: Payer: Self-pay

## 2022-07-16 ENCOUNTER — Encounter (HOSPITAL_COMMUNITY): Payer: Self-pay

## 2022-07-16 ENCOUNTER — Observation Stay (HOSPITAL_BASED_OUTPATIENT_CLINIC_OR_DEPARTMENT_OTHER)
Admission: EM | Admit: 2022-07-16 | Discharge: 2022-07-17 | Disposition: A | Discharge status: Home / Self Care | Payer: PRIVATE HEALTH INSURANCE | Attending: Internal Medicine | Admitting: Internal Medicine

## 2022-07-16 ENCOUNTER — Emergency Department (HOSPITAL_BASED_OUTPATIENT_CLINIC_OR_DEPARTMENT_OTHER): Payer: PRIVATE HEALTH INSURANCE

## 2022-07-16 DIAGNOSIS — E872 Acidosis, unspecified: Secondary | ICD-10-CM | POA: Insufficient documentation

## 2022-07-16 DIAGNOSIS — Z7985 Long-term (current) use of injectable non-insulin antidiabetic drugs: Secondary | ICD-10-CM | POA: Diagnosis not present

## 2022-07-16 DIAGNOSIS — R109 Unspecified abdominal pain: Secondary | ICD-10-CM | POA: Insufficient documentation

## 2022-07-16 DIAGNOSIS — Z79899 Other long term (current) drug therapy: Secondary | ICD-10-CM | POA: Diagnosis not present

## 2022-07-16 DIAGNOSIS — I10 Essential (primary) hypertension: Secondary | ICD-10-CM | POA: Diagnosis not present

## 2022-07-16 DIAGNOSIS — N12 Tubulo-interstitial nephritis, not specified as acute or chronic: Secondary | ICD-10-CM | POA: Diagnosis not present

## 2022-07-16 DIAGNOSIS — E1165 Type 2 diabetes mellitus with hyperglycemia: Secondary | ICD-10-CM

## 2022-07-16 DIAGNOSIS — A599 Trichomoniasis, unspecified: Secondary | ICD-10-CM | POA: Diagnosis not present

## 2022-07-16 DIAGNOSIS — Z7984 Long term (current) use of oral hypoglycemic drugs: Secondary | ICD-10-CM | POA: Insufficient documentation

## 2022-07-16 DIAGNOSIS — B9620 Unspecified Escherichia coli [E. coli] as the cause of diseases classified elsewhere: Secondary | ICD-10-CM | POA: Diagnosis not present

## 2022-07-16 DIAGNOSIS — R350 Frequency of micturition: Secondary | ICD-10-CM | POA: Diagnosis present

## 2022-07-16 DIAGNOSIS — E119 Type 2 diabetes mellitus without complications: Secondary | ICD-10-CM | POA: Diagnosis not present

## 2022-07-16 DIAGNOSIS — N390 Urinary tract infection, site not specified: Secondary | ICD-10-CM | POA: Diagnosis present

## 2022-07-16 DIAGNOSIS — E860 Dehydration: Secondary | ICD-10-CM

## 2022-07-16 DIAGNOSIS — K219 Gastro-esophageal reflux disease without esophagitis: Secondary | ICD-10-CM | POA: Insufficient documentation

## 2022-07-16 LAB — URINALYSIS, ROUTINE W REFLEX MICROSCOPIC
Bilirubin Urine: NEGATIVE
Glucose, UA: NEGATIVE mg/dL
Ketones, ur: NEGATIVE mg/dL
Nitrite: NEGATIVE
Protein, ur: NEGATIVE mg/dL
Specific Gravity, Urine: >=1.03 (ref 1.005–1.030)
pH: 5.5 (ref 5.0–8.0)

## 2022-07-16 LAB — MAGNESIUM: Magnesium: 1.6 mg/dL — ABNORMAL LOW (ref 1.7–2.4)

## 2022-07-16 LAB — COMPREHENSIVE METABOLIC PANEL
ALT: 33 U/L (ref 0–44)
AST: 26 U/L (ref 15–41)
Albumin: 3.7 g/dL (ref 3.5–5.0)
Alkaline Phosphatase: 59 U/L (ref 38–126)
Anion gap: 8 (ref 5–15)
BUN: 9 mg/dL (ref 6–20)
CO2: 22 mmol/L (ref 22–32)
Calcium: 8.7 mg/dL — ABNORMAL LOW (ref 8.9–10.3)
Chloride: 108 mmol/L (ref 98–111)
Creatinine, Ser: 0.71 mg/dL (ref 0.44–1.00)
GFR, Estimated: >60 mL/min (ref >60)
Glucose, Bld: 183 mg/dL — ABNORMAL HIGH (ref 70–99)
Potassium: 3.8 mmol/L (ref 3.5–5.1)
Sodium: 138 mmol/L (ref 135–145)
Total Bilirubin: 0.4 mg/dL (ref 0.3–1.2)
Total Protein: 7.1 g/dL (ref 6.5–8.1)

## 2022-07-16 LAB — CBC WITH DIFFERENTIAL/PLATELET
Abs Immature Granulocytes: 0.02 10*3/uL (ref 0.00–0.07)
Basophils Absolute: 0 10*3/uL (ref 0.0–0.1)
Basophils Relative: 0 %
Eosinophils Absolute: 0.1 10*3/uL (ref 0.0–0.5)
Eosinophils Relative: 2 %
HCT: 35.8 % — ABNORMAL LOW (ref 36.0–46.0)
Hemoglobin: 11.5 g/dL — ABNORMAL LOW (ref 12.0–15.0)
Immature Granulocytes: 0 %
Lymphocytes Relative: 43 %
Lymphs Abs: 2.3 10*3/uL (ref 0.7–4.0)
MCH: 25.4 pg — ABNORMAL LOW (ref 26.0–34.0)
MCHC: 32.1 g/dL (ref 30.0–36.0)
MCV: 79.2 fL — ABNORMAL LOW (ref 80.0–100.0)
Monocytes Absolute: 0.3 10*3/uL (ref 0.1–1.0)
Monocytes Relative: 5 %
Neutro Abs: 2.6 10*3/uL (ref 1.7–7.7)
Neutrophils Relative %: 50 %
Platelets: 252 10*3/uL (ref 150–400)
RBC: 4.52 MIL/uL (ref 3.87–5.11)
RDW: 13.7 % (ref 11.5–15.5)
WBC: 5.3 10*3/uL (ref 4.0–10.5)
nRBC: 0 % (ref 0.0–0.2)

## 2022-07-16 LAB — GLUCOSE, CAPILLARY
Glucose-Capillary: 134 mg/dL — ABNORMAL HIGH (ref 70–99)
Glucose-Capillary: 155 mg/dL — ABNORMAL HIGH (ref 70–99)

## 2022-07-16 LAB — URINALYSIS, MICROSCOPIC (REFLEX)

## 2022-07-16 LAB — PHOSPHORUS: Phosphorus: 2.8 mg/dL (ref 2.5–4.6)

## 2022-07-16 LAB — LACTIC ACID, PLASMA
Lactic Acid, Venous: 2.4 mmol/L (ref 0.5–1.9)
Lactic Acid, Venous: 2.9 mmol/L (ref 0.5–1.9)
Lactic Acid, Venous: 2.3 mmol/L (ref 0.5–1.9)
Lactic Acid, Venous: 1.9 mmol/L (ref 0.5–1.9)

## 2022-07-16 LAB — HEMOGLOBIN A1C
Hgb A1c MFr Bld: 7.8 % — ABNORMAL HIGH (ref 4.8–5.6)
Mean Plasma Glucose: 177.16 mg/dL

## 2022-07-16 LAB — HIV ANTIBODY (ROUTINE TESTING W REFLEX): HIV Screen 4th Generation wRfx: NONREACTIVE

## 2022-07-16 LAB — PREGNANCY, URINE: Preg Test, Ur: NEGATIVE

## 2022-07-16 MED ORDER — SODIUM CHLORIDE 0.9 % IV SOLN: INTRAVENOUS | Status: DC | PRN

## 2022-07-16 MED ORDER — IOHEXOL 300 MG/ML  SOLN
100.0000 mL | Freq: Once | INTRAMUSCULAR | Status: AC | PRN
Start: 2022-07-16 — End: 2022-07-16
  Administered 2022-07-16: 100 mL via INTRAVENOUS

## 2022-07-16 MED ORDER — INSULIN ASPART 100 UNIT/ML IJ SOLN
0.0000 [IU] | Freq: Three times a day (TID) | INTRAMUSCULAR | Status: DC
  Administered 2022-07-16 – 2022-07-17 (×2): 2 [IU] via SUBCUTANEOUS
  Administered 2022-07-17: 3 [IU] via SUBCUTANEOUS

## 2022-07-16 MED ORDER — SODIUM CHLORIDE 0.9 % IV SOLN: INTRAVENOUS | Status: DC

## 2022-07-16 MED ORDER — ESCITALOPRAM OXALATE 20 MG PO TABS
20.0000 mg | ORAL_TABLET | Freq: Every day | ORAL | Status: DC
  Administered 2022-07-16 – 2022-07-17 (×2): 20 mg via ORAL
  Filled 2022-07-16 (×2): qty 1

## 2022-07-16 MED ORDER — ROSUVASTATIN CALCIUM 20 MG PO TABS
20.0000 mg | ORAL_TABLET | Freq: Every day | ORAL | Status: DC
  Administered 2022-07-17: 20 mg via ORAL
  Filled 2022-07-16: qty 1

## 2022-07-16 MED ORDER — ONDANSETRON HCL 4 MG PO TABS: 4.0000 mg | ORAL_TABLET | Freq: Four times a day (QID) | ORAL | Status: DC | PRN

## 2022-07-16 MED ORDER — METRONIDAZOLE 500 MG PO TABS
500.0000 mg | ORAL_TABLET | Freq: Once | ORAL | Status: AC
  Administered 2022-07-16: 500 mg via ORAL
  Filled 2022-07-16: qty 1

## 2022-07-16 MED ORDER — BUPROPION HCL ER (SR) 100 MG PO TB12
100.0000 mg | ORAL_TABLET | Freq: Two times a day (BID) | ORAL | Status: DC
  Administered 2022-07-16 – 2022-07-17 (×2): 100 mg via ORAL
  Filled 2022-07-16 (×2): qty 1

## 2022-07-16 MED ORDER — LACTATED RINGERS IV BOLUS
1000.0000 mL | Freq: Once | INTRAVENOUS | Status: AC
  Administered 2022-07-16: 1000 mL via INTRAVENOUS

## 2022-07-16 MED ORDER — MORPHINE SULFATE (PF) 2 MG/ML IV SOLN: 1.0000 mg | INTRAVENOUS | Status: DC | PRN

## 2022-07-16 MED ORDER — ACETAMINOPHEN 325 MG PO TABS
650.0000 mg | ORAL_TABLET | Freq: Four times a day (QID) | ORAL | Status: DC | PRN
  Administered 2022-07-16 – 2022-07-17 (×3): 650 mg via ORAL
  Filled 2022-07-16 (×3): qty 2

## 2022-07-16 MED ORDER — INSULIN ASPART 100 UNIT/ML IJ SOLN: 0.0000 [IU] | Freq: Every day | INTRAMUSCULAR | Status: DC

## 2022-07-16 MED ORDER — SODIUM CHLORIDE 0.9 % IV SOLN: 1.0000 g | Freq: Once | INTRAVENOUS | Status: DC

## 2022-07-16 MED ORDER — LISINOPRIL 20 MG PO TABS
40.0000 mg | ORAL_TABLET | Freq: Every day | ORAL | Status: DC
  Administered 2022-07-17: 40 mg via ORAL
  Filled 2022-07-16: qty 2

## 2022-07-16 MED ORDER — SODIUM CHLORIDE 0.9 % IV SOLN
1.0000 g | Freq: Once | INTRAVENOUS | Status: AC
  Administered 2022-07-16: 1 g via INTRAVENOUS
  Filled 2022-07-16: qty 10

## 2022-07-16 MED ORDER — PANTOPRAZOLE SODIUM 40 MG PO TBEC
40.0000 mg | DELAYED_RELEASE_TABLET | Freq: Every day | ORAL | Status: DC
  Administered 2022-07-17: 40 mg via ORAL
  Filled 2022-07-16: qty 1

## 2022-07-16 MED ORDER — AZITHROMYCIN 250 MG PO TABS
1000.0000 mg | ORAL_TABLET | Freq: Once | ORAL | Status: AC
  Administered 2022-07-16: 1000 mg via ORAL
  Filled 2022-07-16: qty 4

## 2022-07-16 MED ORDER — CEFTRIAXONE SODIUM 500 MG IJ SOLR
500.0000 mg | Freq: Once | INTRAMUSCULAR | Status: AC
  Administered 2022-07-16: 500 mg via INTRAMUSCULAR
  Filled 2022-07-16: qty 500

## 2022-07-16 MED ORDER — ACETAMINOPHEN 650 MG RE SUPP: 650.0000 mg | Freq: Four times a day (QID) | RECTAL | Status: DC | PRN

## 2022-07-16 MED ORDER — ONDANSETRON HCL 4 MG/2ML IJ SOLN: 4.0000 mg | Freq: Four times a day (QID) | INTRAMUSCULAR | Status: DC | PRN

## 2022-07-16 MED ORDER — METRONIDAZOLE 500 MG PO TABS
500.0000 mg | ORAL_TABLET | Freq: Two times a day (BID) | ORAL | Status: DC
  Administered 2022-07-16 – 2022-07-17 (×2): 500 mg via ORAL
  Filled 2022-07-16 (×2): qty 1

## 2022-07-16 MED ORDER — ENOXAPARIN SODIUM 40 MG/0.4ML IJ SOSY: 40.0000 mg | PREFILLED_SYRINGE | INTRAMUSCULAR | Status: DC

## 2022-07-16 MED ORDER — SODIUM CHLORIDE 0.9 % IV SOLN
1.0000 g | INTRAVENOUS | Status: DC
  Administered 2022-07-17: 1 g via INTRAVENOUS
  Filled 2022-07-16: qty 10

## 2022-07-16 NOTE — Discharge Instructions (Signed)
If your GC/Chl testing is positive you may also need a prescription for doxycycline, but at this time will treat you for trichomonas and E Coli kidney infection.

## 2022-07-16 NOTE — ED Notes (Signed)
ED Provider at bedside. 

## 2022-07-16 NOTE — ED Notes (Signed)
Called to give Rn report states that she has gone to break and asked to call back

## 2022-07-16 NOTE — ED Notes (Signed)
Ambulated to BR, gait steady 

## 2022-07-16 NOTE — ED Provider Notes (Signed)
[facility] EMERGENCY DEPARTMENT Provider Note   CSN: [id number] Arrival date & time: 07/16/22  0749     History  Chief Complaint  Patient presents with   Back Pain    Kelly Sweeney is a 31 y.o. female.  HPI     31yo female with history of hypertension, DM who presents with concern for flank pain, urinary urgency that has continued despite completed course of macrobid for UTI given for positive E Coli urine culture 8/10.   UTI, finished macrobid Thursday Still has urgency Bilateral flank pain for 2 weeks  Urine culture showed E Coli 50-100000, susceptible to cephalosporins 8/6 WF. Had these symptoms of urinary symptoms and back pain at the time she was diagnosed.   Aching in legs, joint pains, like bones aching No known fevers  Nausea, on cycle as well No vomiting  No sore throat, cough, chest pain, dyspnea  Fatigue 1-2/10 aching pain to bilateral flank Last night had a little pain in stomach but otherwise no significant abdominal pain Urgency, frequency, no dyurai Past Medical History:  Diagnosis Date   Diabetes mellitus without complication (HCC)    Hypertension     Home Medications Prior to Admission medications   Medication Sig Start Date End Date Taking? Authorizing Provider  buPROPion (WELLBUTRIN SR) 100 MG 12 hr tablet Take 100 mg by mouth 2 (two) times daily. 06/03/20  Yes [provider]  escitalopram (LEXAPRO) 20 MG tablet Take 20 mg by mouth daily. 06/03/20  Yes [provider]  lisinopril (ZESTRIL) 40 MG tablet Take 40 mg by mouth daily.   Yes [provider]  metFORMIN (GLUCOPHAGE-XR) 500 MG 24 hr tablet Take 1,000 mg by mouth in the morning and at bedtime. 06/22/20  Yes [provider]  rosuvastatin (CRESTOR) 20 MG tablet Take 20 mg by mouth daily. 02/18/20  Yes [provider]  Semaglutide, 1 MG/DOSE, (OZEMPIC, 1 MG/DOSE,) 2 MG/1.5ML SOPN Inject 2 mg into the skin once a week. 08/03/20  Yes [provider]  Dexlansoprazole (DEXILANT) 30 MG capsule Take 1 capsule (30 mg total) by mouth daily. Patient not taking: Reported on 07/16/2022 06/10/21   Dartha Lodge, PA-C  dicyclomine (BENTYL) 20 MG tablet Take 1 tablet (20 mg total) by mouth 2 (two) times daily. Patient not taking: Reported on 07/16/2022 06/10/21   Dartha Lodge, PA-C  hydrOXYzine (ATARAX/VISTARIL) 25 MG tablet Take 1 tablet (25 mg total) by mouth every 8 (eight) hours as needed. Patient not taking: Reported on 07/16/2022 04/07/21   Haskel Schroeder, PA-C  ibuprofen (ADVIL) 800 MG tablet Take 1 tablet (800 mg total) by mouth every 6 (six) hours as needed for moderate pain. Patient not taking: Reported on 07/16/2022 01/04/22   Gilda Crease, MD  methocarbamol (ROBAXIN) 500 MG tablet Take 1 tablet (500 mg total) by mouth every 8 (eight) hours as needed for muscle spasms. Patient not taking: Reported on 07/16/2022 01/04/22   Gilda Crease, MD  metroNIDAZOLE (FLAGYL) 500 MG tablet Take 1 tablet (500 mg total) by mouth 2 (two) times daily. Patient not taking: Reported on 07/16/2022 02/18/21   Cristina Gong, PA-C  nystatin cream (MYCOSTATIN) Apply to affected area 2 times daily Patient not taking: Reported on 07/16/2022 08/28/20   Bing Neighbors, FNP  ondansetron (ZOFRAN ODT) 4 MG disintegrating tablet 4mg  ODT q4 hours prn nausea/vomit Patient not taking: Reported on 07/16/2022 06/10/21   06/12/21, PA-C      Allergies  Patient has no known allergies.    Review of Systems   Review of Systems  Physical Exam Updated Vital Signs BP (!) 143/92 (BP Location: Right Arm)   Pulse (!) 101   Temp 98.5 F (36.9 C) (Oral)   Resp 17   Ht 5\' 9"  (1.753 m)   Wt 131.1 kg   LMP 07/16/2022   SpO2 100%   BMI 42.68 kg/m  Physical Exam Vitals and nursing note reviewed.  Constitutional:      General: She is not in acute distress.    Appearance: She is well-developed. She is not diaphoretic.  HENT:      Head: Normocephalic and atraumatic.  Eyes:     Conjunctiva/sclera: Conjunctivae normal.  Cardiovascular:     Rate and Rhythm: Normal rate and regular rhythm.     Heart sounds: Normal heart sounds. No murmur heard.    No friction rub. No gallop.  Pulmonary:     Effort: Pulmonary effort is normal. No respiratory distress.     Breath sounds: Normal breath sounds. No wheezing or rales.  Abdominal:     General: There is no distension.     Palpations: Abdomen is soft.     Tenderness: There is no abdominal tenderness. There is no guarding.  Musculoskeletal:        General: No tenderness.     Cervical back: Normal range of motion.  Skin:    General: Skin is warm and dry.     Findings: No erythema or rash.  Neurological:     Mental Status: She is alert and oriented to person, place, and time.     ED Results / Procedures / Treatments   Labs (all labs ordered are listed, but only abnormal results are displayed) Labs Reviewed  URINALYSIS, ROUTINE W REFLEX MICROSCOPIC - Abnormal; Notable for the following components:      Result Value   Hgb urine dipstick LARGE (*)    Leukocytes,Ua TRACE (*)    All other components within normal limits  CBC WITH DIFFERENTIAL/PLATELET - Abnormal; Notable for the following components:   Hemoglobin 11.5 (*)    HCT 35.8 (*)    MCV 79.2 (*)    MCH 25.4 (*)    All other components within normal limits  COMPREHENSIVE METABOLIC PANEL - Abnormal; Notable for the following components:   Glucose, Bld 183 (*)    Calcium 8.7 (*)    All other components within normal limits  LACTIC ACID, PLASMA - Abnormal; Notable for the following components:   Lactic Acid, Venous 2.4 (*)    All other components within normal limits  LACTIC ACID, PLASMA - Abnormal; Notable for the following components:   Lactic Acid, Venous 2.9 (*)    All other components within normal limits  URINALYSIS, MICROSCOPIC (REFLEX) - Abnormal; Notable for the following components:   Bacteria, UA  FEW (*)    Trichomonas, UA PRESENT (*)    All other components within normal limits  MAGNESIUM - Abnormal; Notable for the following components:   Magnesium 1.6 (*)    All other components within normal limits  GLUCOSE, CAPILLARY - Abnormal; Notable for the following components:   Glucose-Capillary 134 (*)    All other components within normal limits  CULTURE, BLOOD (ROUTINE X 2)  URINE CULTURE  PREGNANCY, URINE  PHOSPHORUS  LACTIC ACID, PLASMA  LACTIC ACID, PLASMA  HIV ANTIBODY (ROUTINE TESTING W REFLEX)  HEMOGLOBIN A1C  CBC  BASIC METABOLIC PANEL  GC/CHLAMYDIA PROBE AMP ([place])  NOT AT [facility]    EKG None  Radiology CT ABDOMEN PELVIS W CONTRAST  Result Date: 07/16/2022 CLINICAL DATA:  Pyelonephritis, recently treated UTI, body aches EXAM: CT ABDOMEN AND PELVIS WITH CONTRAST TECHNIQUE: Multidetector CT imaging of the abdomen and pelvis was performed using the standard protocol following bolus administration of intravenous contrast. RADIATION DOSE REDUCTION: This exam was performed according to the departmental dose-optimization program which includes automated exposure control, adjustment of the mA and/or kV according to patient size and/or use of iterative reconstruction technique. CONTRAST:  OMNIPAQUE IOHEXOL 300 MG/ML  SOLN COMPARISON:  01/04/2022 FINDINGS: Lower chest: No acute abnormality. Hepatobiliary: No focal liver abnormality is seen. No gallstones, gallbladder wall thickening, or biliary dilatation. Pancreas: Unremarkable. No pancreatic ductal dilatation or surrounding inflammatory changes. Spleen: Normal in size without focal abnormality. Adrenals/Urinary Tract: Adrenal glands are unremarkable. Kidneys are normal, without renal calculi, focal lesion, or hydronephrosis. Bladder is unremarkable. Stomach/Bowel: Stomach is within normal limits. No evidence of bowel wall thickening, distention, or inflammatory changes. Appendix is normal. Vascular/Lymphatic: No significant  vascular findings are present. No enlarged abdominal or pelvic lymph nodes. Reproductive: Uterus and bilateral adnexa are unremarkable. Other: No abdominal wall hernia or abnormality. No abdominopelvic ascites. Musculoskeletal: No acute osseous abnormality. No aggressive osseous lesion. IMPRESSION: 1. No acute intra-abdominal or pelvic pathology. Electronically Signed   By: Elige Ko M.D.   On: 07/16/2022 13:17    Procedures Procedures    Medications Ordered in ED Medications  0.9 %  sodium chloride infusion ( Intravenous New Bag/Given 07/16/22 0859)  0.9 %  sodium chloride infusion ( Intravenous New Bag/Given 07/16/22 1700)  acetaminophen (TYLENOL) tablet 650 mg (650 mg Oral Given 07/16/22 1708)    Or  acetaminophen (TYLENOL) suppository 650 mg ( Rectal See Alternative 07/16/22 1708)  morphine (PF) 2 MG/ML injection 1 mg (has no administration in time range)  ondansetron (ZOFRAN) tablet 4 mg (has no administration in time range)    Or  ondansetron (ZOFRAN) injection 4 mg (has no administration in time range)  metroNIDAZOLE (FLAGYL) tablet 500 mg (has no administration in time range)  cefTRIAXone (ROCEPHIN) 1 g in sodium chloride 0.9 % 100 mL IVPB (has no administration in time range)  insulin aspart (novoLOG) injection 0-15 Units (2 Units Subcutaneous Given 07/16/22 1709)  insulin aspart (novoLOG) injection 0-5 Units (has no administration in time range)  lisinopril (ZESTRIL) tablet 40 mg (has no administration in time range)  rosuvastatin (CRESTOR) tablet 20 mg (has no administration in time range)  buPROPion ER (WELLBUTRIN SR) 12 hr tablet 100 mg (has no administration in time range)  escitalopram (LEXAPRO) tablet 20 mg (has no administration in time range)  pantoprazole (PROTONIX) EC tablet 40 mg (has no administration in time range)  cefTRIAXone (ROCEPHIN) 1 g in sodium chloride 0.9 % 100 mL IVPB (0 g Intravenous Stopped 07/16/22 0932)  lactated ringers bolus 1,000 mL (0 mLs Intravenous  Stopped 07/16/22 1149)  metroNIDAZOLE (FLAGYL) tablet 500 mg (500 mg Oral Given 07/16/22 0959)  cefTRIAXone (ROCEPHIN) injection 500 mg (500 mg Intramuscular Given 07/16/22 1117)  azithromycin (ZITHROMAX) tablet 1,000 mg (1,000 mg Oral Given 07/16/22 1116)  lactated ringers bolus 1,000 mL (1,000 mLs Intravenous New Bag/Given 07/16/22 1252)  iohexol (OMNIPAQUE) 300 MG/ML solution 100 mL (100 mLs Intravenous Contrast Given 07/16/22 1255)    ED Course/ Medical Decision Making/ A&P  Medical Decision Making Amount and/or Complexity of Data Reviewed Labs: ordered. Radiology: ordered.  Risk Prescription drug management. Decision regarding hospitalization.    31yo female with history of hypertension, DM who presents with concern for flank pain, urinary urgency that has continued despite completed course of macrobid for UTI given for positive E Coli urine culture 8/10.  History concerning for continued urinary symptoms/pyelonephritis not properly treated by macrobid.  Tachycardic on arrival, given IV fluids, rocephin.  Labs returned normal with exception of lactic acid. Consider possible dehydration.    Labs repeated after fluids and abx show increase in lactic acid to 2.9.  Discussed options with patient of ED stay vs admission. Given tachycardia, lactate, clinical concern for pyelonephritis will admit for continued care.  Also consider lactate related to dehydration or metformin.  UA positive for trichomonas. Given IM rocephin, azithromycin empirically with plan to start doxy if self swab positive for GC/chl.  Not having lower abdominal/pelvic pain or discharge and doubt PID.    Discussed admission with Dr. Jacqulyn Bath. WIll obtain CT abd/pelvis to further evaluate flank pain. CT shows no acute abnormalities.         Final Clinical Impression(s) / ED Diagnoses Final diagnoses:  Pyelonephritis    Rx / DC Orders ED Discharge Orders     None          Alvira Monday, MD 07/16/22 1740

## 2022-07-16 NOTE — H&P (Signed)
History and Physical    Kelly Sweeney [id number] DOB: 1991/08/19 DOA: 07/16/2022  PCP: Forrestine Him, PA-C  Patient coming from: [place] have personally briefly reviewed patient's old medical records in [place]  Chief Complaint: Frequent urination  HPI: Kelly Sweeney is a 31 y.o. female with medical history significant of uncontrolled type 2 diabetes, hypertension, obesity, frequent yeast infection present here from [place] with complaining of back pain and frequent urination.  Patient recently treated for a UTI with Macrobid twice a day for 7 days which she finished on Thursday as prescribed.  She continues to have frequent urination, lower back pain which she thinks is secondary to her cycle.  LMP: 07/11/2022, regular but heavy cycle.  Reports vaginal itching secondary to recent vaginal candidiasis that was treated with fluconazole for 3 days due to recent antibiotic use.  No fever, chills, dysuria, urgency, abdominal pain, vaginal discharge, shortness of breath, palpitation, lightheadedness or dizziness.  She is in monogamous relationship with boyfriend.  No history of tobacco abuse, alcohol abuse, illicit drug use. Concerned about her overall symptoms and lactic acidosis.  She is tearful.  She has a strong family history of CKD in her dad and her grandmom died due to kidney failure.  ED Course: Upon arrival to ED, temperature 97.8, pulse 116, RR: 16, BP: 138/84, maintaining oxygen saturation on room air, lactic acid 2.4 trended up to 2.9.  UA positive for trichomoniasis.  Patient was given IV fluids, Rocephin, azithromycin and Flagyl in ED.  CT abdomen/pelvis negative for any acute findings.  Patient transferred [facility] for further evaluation and management of lactic acidosis.  Review of Systems: As per HPI otherwise negative.    Past Medical History:  Diagnosis Date   Diabetes mellitus without complication ([place])    Hypertension      History reviewed. No pertinent surgical history.   reports that she has never smoked. She has never used smokeless tobacco. She reports that she does not currently use drugs. She reports that she does not drink alcohol.  No Known Allergies  History reviewed. No pertinent family history.  Prior to Admission medications   Medication Sig Start Date End Date Taking? Authorizing Provider  buPROPion (WELLBUTRIN SR) 100 MG 12 hr tablet Take 100 mg by mouth 2 (two) times daily. 06/03/20  Yes [provider]  escitalopram (LEXAPRO) 20 MG tablet Take 20 mg by mouth daily. 06/03/20  Yes [provider]  lisinopril (ZESTRIL) 40 MG tablet Take 40 mg by mouth daily.   Yes [provider]  metFORMIN (GLUCOPHAGE-XR) 500 MG 24 hr tablet Take 1,000 mg by mouth in the morning and at bedtime. 06/22/20  Yes [provider]  rosuvastatin (CRESTOR) 20 MG tablet Take 20 mg by mouth daily. 02/18/20  Yes [provider]  Semaglutide, 1 MG/DOSE, (OZEMPIC, 1 MG/DOSE,) 2 MG/1.5ML SOPN Inject 2 mg into the skin once a week. 08/03/20  Yes [provider]  Dexlansoprazole (DEXILANT) 30 MG capsule Take 1 capsule (30 mg total) by mouth daily. Patient not taking: Reported on 07/16/2022 06/10/21   Jacqlyn Larsen, PA-C  dicyclomine (BENTYL) 20 MG tablet Take 1 tablet (20 mg total) by mouth 2 (two) times daily. Patient not taking: Reported on 07/16/2022 06/10/21   Jacqlyn Larsen, PA-C  hydrOXYzine (ATARAX/VISTARIL) 25 MG tablet Take 1 tablet (25 mg total) by mouth every 8 (eight) hours as needed. Patient not taking: Reported on 07/16/2022 04/07/21  Loni Beckwith, PA-C  ibuprofen (ADVIL) 800 MG tablet Take 1 tablet (800 mg total) by mouth every 6 (six) hours as needed for moderate pain. Patient not taking: Reported on 07/16/2022 01/04/22   Orpah Greek, MD  methocarbamol (ROBAXIN) 500 MG tablet Take 1 tablet (500 mg total) by mouth every 8 (eight) hours as needed for  muscle spasms. Patient not taking: Reported on 07/16/2022 01/04/22   Orpah Greek, MD  metroNIDAZOLE (FLAGYL) 500 MG tablet Take 1 tablet (500 mg total) by mouth 2 (two) times daily. Patient not taking: Reported on 07/16/2022 02/18/21   Lorin Glass, PA-C  nystatin cream (MYCOSTATIN) Apply to affected area 2 times daily Patient not taking: Reported on 07/16/2022 08/28/20   Scot Jun, FNP  ondansetron (ZOFRAN ODT) 4 MG disintegrating tablet 4mg  ODT q4 hours prn nausea/vomit Patient not taking: Reported on 07/16/2022 06/10/21   Jacqlyn Larsen, PA-C    Physical Exam: Vitals:   07/16/22 1200 07/16/22 1310 07/16/22 1311 07/16/22 1419  BP: (!) 131/93 126/89  (!) 159/90  Pulse: 100 (!) 105  (!) 102  Resp:    16  Temp:   98.4 F (36.9 C) 98.8 F (37.1 C)  TempSrc:   Oral Oral  SpO2: 100% 100%  100%    Constitutional: NAD, calm, comfortable, on room air, communicating well, tearful Eyes: PERRL, lids and conjunctivae normal ENMT: Mucous membranes are moist. Posterior pharynx clear of any exudate or lesions.Normal dentition.  Neck: normal, supple, no masses, no thyromegaly Respiratory: clear to auscultation bilaterally, no wheezing, no crackles. Normal respiratory effort. No accessory muscle use.  Cardiovascular: Regular rate and rhythm, no murmurs / rubs / gallops. No extremity edema. 2+ pedal pulses. No carotid bruits.  Abdomen: no tenderness, no masses palpated. No hepatosplenomegaly. Bowel sounds positive.  Musculoskeletal: no clubbing / cyanosis. No joint deformity upper and lower extremities. Good ROM, no contractures. Normal muscle tone.  Skin: no rashes, lesions, ulcers. No induration Neurologic: CN 2-12 grossly intact. Sensation intact, DTR normal. Strength 5/5 in all 4.  Psychiatric: Normal judgment and insight. Alert and oriented x 3. Normal mood.    Labs on Admission: I have personally reviewed following labs and imaging studies  CBC: Recent Labs  Lab  07/16/22 0853  WBC 5.3  NEUTROABS 2.6  HGB 11.5*  HCT 35.8*  MCV 79.2*  PLT [id number]   Basic Metabolic Panel: Recent Labs  Lab 07/16/22 0853  NA 138  K 3.8  CL 108  CO2 22  GLUCOSE 183*  BUN 9  CREATININE 0.71  CALCIUM 8.7*   GFR: CrCl cannot be calculated (Unknown ideal weight.). Liver Function Tests: Recent Labs  Lab 07/16/22 0853  AST 26  ALT 33  ALKPHOS 59  BILITOT 0.4  PROT 7.1  ALBUMIN 3.7   No results for input(s): "LIPASE", "AMYLASE" in the last 168 hours. No results for input(s): "AMMONIA" in the last 168 hours. Coagulation Profile: No results for input(s): "INR", "PROTIME" in the last 168 hours. Cardiac Enzymes: No results for input(s): "CKTOTAL", "CKMB", "CKMBINDEX", "TROPONINI" in the last 168 hours. BNP (last 3 results) No results for input(s): "PROBNP" in the last 8760 hours. HbA1C: No results for input(s): "HGBA1C" in the last 72 hours. CBG: No results for input(s): "GLUCAP" in the last 168 hours. Lipid Profile: No results for input(s): "CHOL", "HDL", "LDLCALC", "TRIG", "CHOLHDL", "LDLDIRECT" in the last 72 hours. Thyroid Function Tests: No results for input(s): "TSH", "T4TOTAL", "FREET4", "T3FREE", "THYROIDAB" in the last 72  hours. Anemia Panel: No results for input(s): "VITAMINB12", "FOLATE", "FERRITIN", "TIBC", "IRON", "RETICCTPCT" in the last 72 hours. Urine analysis:    Component Value Date/Time   COLORURINE YELLOW 07/16/2022 0907   APPEARANCEUR CLEAR 07/16/2022 0907   LABSPEC >=1.030 07/16/2022 0907   PHURINE 5.5 07/16/2022 0907   GLUCOSEU NEGATIVE 07/16/2022 0907   HGBUR LARGE (A) 07/16/2022 0907   BILIRUBINUR NEGATIVE 07/16/2022 0907   BILIRUBINUR negative 08/27/2020 1840   KETONESUR NEGATIVE 07/16/2022 0907   PROTEINUR NEGATIVE 07/16/2022 0907   UROBILINOGEN 0.2 08/27/2020 1840   NITRITE NEGATIVE 07/16/2022 0907   LEUKOCYTESUR TRACE (A) 07/16/2022 0907    Radiological Exams on Admission: CT ABDOMEN PELVIS W CONTRAST  Result  Date: 07/16/2022 CLINICAL DATA:  Pyelonephritis, recently treated UTI, body aches EXAM: CT ABDOMEN AND PELVIS WITH CONTRAST TECHNIQUE: Multidetector CT imaging of the abdomen and pelvis was performed using the standard protocol following bolus administration of intravenous contrast. RADIATION DOSE REDUCTION: This exam was performed according to the departmental dose-optimization program which includes automated exposure control, adjustment of the mA and/or kV according to patient size and/or use of iterative reconstruction technique. CONTRAST:  OMNIPAQUE IOHEXOL 300 MG/ML  SOLN COMPARISON:  01/04/2022 FINDINGS: Lower chest: No acute abnormality. Hepatobiliary: No focal liver abnormality is seen. No gallstones, gallbladder wall thickening, or biliary dilatation. Pancreas: Unremarkable. No pancreatic ductal dilatation or surrounding inflammatory changes. Spleen: Normal in size without focal abnormality. Adrenals/Urinary Tract: Adrenal glands are unremarkable. Kidneys are normal, without renal calculi, focal lesion, or hydronephrosis. Bladder is unremarkable. Stomach/Bowel: Stomach is within normal limits. No evidence of bowel wall thickening, distention, or inflammatory changes. Appendix is normal. Vascular/Lymphatic: No significant vascular findings are present. No enlarged abdominal or pelvic lymph nodes. Reproductive: Uterus and bilateral adnexa are unremarkable. Other: No abdominal wall hernia or abnormality. No abdominopelvic ascites. Musculoskeletal: No acute osseous abnormality. No aggressive osseous lesion. IMPRESSION: 1. No acute intra-abdominal or pelvic pathology. Electronically Signed   By: Elige Ko M.D.   On: 07/16/2022 13:17      Assessment/Plan Active Problems:   Pyelonephritis   UTI (urinary tract infection)   Type 2 diabetes mellitus (HCC)   Lactic acidosis   Urinary frequency: -Likely in the setting of uncontrolled diabetes.  UA positive for trace leukocytes, hemoglobin as  patient is on her menstrual cycle. -Finished Macrobid 2 days ago as prescribed -urine Culture is pending.  Patient is afebrile with no leukocytosis. -CT abdomen/pelvis negative for any acute findings.  Trichomoniasis: -UA positive for trichomoniasis.  GC/chlamydia pending. -Received Flagyl, Rocephin and azithromycin in ED.  We will continue Flagyl 500 twice daily for 7 days and Rocephin for now -Pregnancy test negative  Hypertension: Stable.  Continue home lisinopril.  Lactic acidosis: -Likely in the setting of dehydration and the setting of frequent urination and metformin -Continue IV fluids.  Trend lactic acid.  Blood culture is obtained and is pending -Hold metformin at this time  Uncontrolled type 2 diabetes: Last A1c 8.2% -Hold metformin and Ozempic at this time.  Start sliding scale insulin and monitor blood sugar closely  Hyperlipidemia: Continue statin  GERD: Continue PPI  Depression with anxiety: Continue home medications Lexapro and Wellbutrin  Morbid obesity with BMI of 44: -Diet modification, exercise and weight loss recommended   DVT prophylaxis: SCD Code Status: Full code Family Communication: None present at bedside.  Plan of care discussed with patient in length and he verbalized understanding and agreed with it. Disposition Plan: Home Consults called: None Admission status: Observation  Ollen Bowl MD [facility]  If 7PM-7AM, please contact night-coverage [email or web address]  07/16/2022, 3:42 PM

## 2022-07-16 NOTE — ED Triage Notes (Addendum)
Pt reports upper back pain for the past 2 weeks. Recently treated for a UTI and has been on Macrobid for past 7 days.  Pt then adds she is having body aches. Denies cough, sore throat, headache, or n/v/d.

## 2022-07-17 DIAGNOSIS — E860 Dehydration: Secondary | ICD-10-CM

## 2022-07-17 DIAGNOSIS — E66813 Obesity, class 3: Secondary | ICD-10-CM | POA: Diagnosis present

## 2022-07-17 DIAGNOSIS — A599 Trichomoniasis, unspecified: Secondary | ICD-10-CM

## 2022-07-17 DIAGNOSIS — E872 Acidosis, unspecified: Secondary | ICD-10-CM | POA: Diagnosis not present

## 2022-07-17 DIAGNOSIS — I10 Essential (primary) hypertension: Secondary | ICD-10-CM

## 2022-07-17 LAB — GC/CHLAMYDIA PROBE AMP (~~LOC~~) NOT AT ARMC
Chlamydia: NEGATIVE
Comment: NEGATIVE
Comment: NORMAL
Neisseria Gonorrhea: NEGATIVE

## 2022-07-17 LAB — BASIC METABOLIC PANEL
Anion gap: 7 (ref 5–15)
BUN: 9 mg/dL (ref 6–20)
CO2: 22 mmol/L (ref 22–32)
Calcium: 8.9 mg/dL (ref 8.9–10.3)
Chloride: 112 mmol/L — ABNORMAL HIGH (ref 98–111)
Creatinine, Ser: 0.83 mg/dL (ref 0.44–1.00)
GFR, Estimated: >60 mL/min (ref >60)
Glucose, Bld: 176 mg/dL — ABNORMAL HIGH (ref 70–99)
Potassium: 3.9 mmol/L (ref 3.5–5.1)
Sodium: 141 mmol/L (ref 135–145)

## 2022-07-17 LAB — GLUCOSE, CAPILLARY
Glucose-Capillary: 157 mg/dL — ABNORMAL HIGH (ref 70–99)
Glucose-Capillary: 142 mg/dL — ABNORMAL HIGH (ref 70–99)

## 2022-07-17 LAB — CBC
HCT: 34.8 % — ABNORMAL LOW (ref 36.0–46.0)
Hemoglobin: 11 g/dL — ABNORMAL LOW (ref 12.0–15.0)
MCH: 25.7 pg — ABNORMAL LOW (ref 26.0–34.0)
MCHC: 31.6 g/dL (ref 30.0–36.0)
MCV: 81.3 fL (ref 80.0–100.0)
Platelets: 296 10*3/uL (ref 150–400)
RBC: 4.28 MIL/uL (ref 3.87–5.11)
RDW: 13.6 % (ref 11.5–15.5)
WBC: 8.1 10*3/uL (ref 4.0–10.5)
nRBC: 0 % (ref 0.0–0.2)

## 2022-07-17 MED ORDER — METRONIDAZOLE 500 MG PO TABS: 500.0000 mg | ORAL_TABLET | Freq: Two times a day (BID) | ORAL | 0 refills | Status: AC

## 2022-07-17 NOTE — Progress Notes (Signed)
Patient was given discharge instructions, and all questions were answered. Patient was stable for discharge and was walked to the main exit. 

## 2022-07-17 NOTE — Discharge Summary (Signed)
Physician Discharge Summary  Kelly Sweeney YQM:[id number] DOB: 1991/01/23 DOA: 07/16/2022  PCP: Kelly Ryder, PA-C  Admit date: 07/16/2022 Discharge date: 07/17/2022  Admitted From: Home Disposition: Home  Recommendations for Outpatient Follow-up:  Follow up with PCP in 1-2 weeks Please obtain BMP/CBC in one week    Discharge Condition: Stable CODE STATUS: Full code Diet recommendation: Carb modified  Brief/Interim Summary: 31 year old female with a history of hypertension, diabetes, class III obesity, who was recently treated for a urinary tract infection.  She reports completing 7 days of Macrobid.  She was told by urgent care that her urine culture was positive for E. coli, which was sensitive to Macrobid.  She comes to the emergency room due to low back pain as well as urinary frequency which she felt may be persistent UTI symptoms.  Urinalysis does not show any significant leukocytes in urine.  She was afebrile and had normal WBC count.  Lactic acid was elevated on admission at 2.9.  She was started on IV fluids.  This corrected back to normal range with hydration.  Since admission, her urinary frequency has resolved and she is no longer having back pain.  She feels ready for discharge home.  Since she does not have any other signs of persistent UTI, and she was just completed treatment with Macrobid, it would be unlikely that she has recurrent UTI.  We will treat her with Flagyl x7 days for trichomonas and have advised that her partner also be tested.  She has been on metformin for several years and reports chronic issues with associated diarrhea.  She has been advised to take metformin with food.  She also reports a recent increase of semaglutide dose from 1mg  to 2 mg weekly.  She has been advised to watch for any adverse effects including diarrhea, nausea and vomiting as well as abdominal pain with concerns for pancreatitis.  She says she has follow-up with endocrinology.  Discharge  Diagnoses:  Active Problems:   Type 2 diabetes mellitus (HCC)   Lactic acidosis   Obesity, Class III, BMI 40-49.9 (morbid obesity) (HCC)   Dehydration   Trichomoniasis   Essential hypertension    Discharge Instructions  Discharge Instructions     Diet - low sodium heart healthy   Complete by: As directed    Increase activity slowly   Complete by: As directed       Allergies as of 07/17/2022   No Known Allergies      Medication List     STOP taking these medications    Dexlansoprazole 30 MG capsule DR Commonly known as: Dexilant   dicyclomine 20 MG tablet Commonly known as: BENTYL   hydrOXYzine 25 MG tablet Commonly known as: ATARAX   methocarbamol 500 MG tablet Commonly known as: ROBAXIN   nystatin cream Commonly known as: MYCOSTATIN   ondansetron 4 MG disintegrating tablet Commonly known as: Zofran ODT       TAKE these medications    buPROPion ER 100 MG 12 hr tablet Commonly known as: WELLBUTRIN SR Take 100 mg by mouth 2 (two) times daily.   escitalopram 20 MG tablet Commonly known as: LEXAPRO Take 20 mg by mouth daily.   ibuprofen 800 MG tablet Commonly known as: ADVIL Take 1 tablet (800 mg total) by mouth every 6 (six) hours as needed for moderate pain.   lisinopril 40 MG tablet Commonly known as: ZESTRIL Take 40 mg by mouth daily.   metFORMIN 500 MG 24 hr tablet Commonly known as: GLUCOPHAGE-XR  Take 1,000 mg by mouth in the morning and at bedtime.   metroNIDAZOLE 500 MG tablet Commonly known as: FLAGYL Take 1 tablet (500 mg total) by mouth 2 (two) times daily for 7 days.   Ozempic (1 MG/DOSE) 2 MG/1.5ML Sopn Generic drug: Semaglutide (1 MG/DOSE) Inject 2 mg into the skin once a week.   rosuvastatin 20 MG tablet Commonly known as: CRESTOR Take 20 mg by mouth daily.        Follow-up Information     Kelly Sweeney, [place]. Schedule an appointment as soon as possible for a visit .   Specialty: Family Medicine Contact  information: [address] [place] [address] [phone]                No Known Allergies  Consultations:    Procedures/Studies: CT ABDOMEN PELVIS W CONTRAST  Result Date: 07/16/2022 CLINICAL DATA:  Pyelonephritis, recently treated UTI, body aches EXAM: CT ABDOMEN AND PELVIS WITH CONTRAST TECHNIQUE: Multidetector CT imaging of the abdomen and pelvis was performed using the standard protocol following bolus administration of intravenous contrast. RADIATION DOSE REDUCTION: This exam was performed according to the departmental dose-optimization program which includes automated exposure control, adjustment of the mA and/or kV according to patient size and/or use of iterative reconstruction technique. CONTRAST:  OMNIPAQUE IOHEXOL 300 MG/ML  SOLN COMPARISON:  01/04/2022 FINDINGS: Lower chest: No acute abnormality. Hepatobiliary: No focal liver abnormality is seen. No gallstones, gallbladder wall thickening, or biliary dilatation. Pancreas: Unremarkable. No pancreatic ductal dilatation or surrounding inflammatory changes. Spleen: Normal in size without focal abnormality. Adrenals/Urinary Tract: Adrenal glands are unremarkable. Kidneys are normal, without renal calculi, focal lesion, or hydronephrosis. Bladder is unremarkable. Stomach/Bowel: Stomach is within normal limits. No evidence of bowel wall thickening, distention, or inflammatory changes. Appendix is normal. Vascular/Lymphatic: No significant vascular findings are present. No enlarged abdominal or pelvic lymph nodes. Reproductive: Uterus and bilateral adnexa are unremarkable. Other: No abdominal wall hernia or abnormality. No abdominopelvic ascites. Musculoskeletal: No acute osseous abnormality. No aggressive osseous lesion. IMPRESSION: 1. No acute intra-abdominal or pelvic pathology. Electronically Signed   By: Elige Ko M.D.   On: 07/16/2022 13:17      Subjective: She is feeling better today.  No longer  having back pain or frequency.  She wants to go home.  Discharge Exam: Vitals:   07/17/22 0111 07/17/22 0517 07/17/22 0931 07/17/22 1151  BP: 124/80 (!) 140/95 (!) 147/89 (!) 143/94  Pulse: 98 (!) 105 (!) 109 (!) 101  Resp: 18 18 17 17   Temp: 98.2 F (36.8 C) 98.5 F (36.9 C) 98.7 F (37.1 C) 98.2 F (36.8 C)  TempSrc: Oral Oral Oral Oral  SpO2: 99% 100% 99% 100%  Weight:      Height:        General: Pt is alert, awake, not in acute distress Cardiovascular: RRR, S1/S2 +, no rubs, no gallops Respiratory: CTA bilaterally, no wheezing, no rhonchi Abdominal: Soft, NT, ND, bowel sounds + Extremities: no edema, no cyanosis    The results of significant diagnostics from this hospitalization (including imaging, microbiology, ancillary and laboratory) are listed below for reference.     Microbiology: Recent Results (from the past 240 hour(s))  Blood culture (routine x 2)     Status: None (Preliminary result)   Collection Time: 07/16/22 12:45 PM   Specimen: Right Antecubital; Blood  Result Value Ref Range Status   Specimen Description   Final    RIGHT ANTECUBITAL BLOOD Performed at  [facility], [address] [place], [place] [address]    Special Requests   Final    Blood Culture adequate volume BOTTLES DRAWN AEROBIC AND ANAEROBIC Performed at [facility], [address] [place], [place] [address]    Culture   Final    NO GROWTH < 24 HOURS Performed at [facility], [address] [place], [place] [address]    Report Status PENDING  Incomplete     Labs: BNP (last 3 results) No results for input(s): "BNP" in the last 8760 hours. Basic Metabolic Panel: Recent Labs  Lab 07/16/22 0853 07/16/22 1634 07/17/22 0429  NA 138  --  141  K 3.8  --  3.9  CL 108  --  112*  CO2 22  --  22  GLUCOSE 183*  --  176*  BUN 9  --  9  CREATININE 0.71  --  0.83  CALCIUM 8.7*  --  8.9  MG  --  1.6*  --   PHOS  --  2.8  --    Liver Function  Tests: Recent Labs  Lab 07/16/22 0853  AST 26  ALT 33  ALKPHOS 59  BILITOT 0.4  PROT 7.1  ALBUMIN 3.7   No results for input(s): "LIPASE", "AMYLASE" in the last 168 hours. No results for input(s): "AMMONIA" in the last 168 hours. CBC: Recent Labs  Lab 07/16/22 0853 07/17/22 0429  WBC 5.3 8.1  NEUTROABS 2.6  --   HGB 11.5* 11.0*  HCT 35.8* 34.8*  MCV 79.2* 81.3  PLT 252 296   Cardiac Enzymes: No results for input(s): "CKTOTAL", "CKMB", "CKMBINDEX", "TROPONINI" in the last 168 hours. BNP: Invalid input(s): "POCBNP" CBG: Recent Labs  Lab 07/16/22 1642 07/16/22 2111 07/17/22 0727 07/17/22 1146  GLUCAP 134* 155* 157* 142*   D-Dimer No results for input(s): "DDIMER" in the last 72 hours. Hgb A1c Recent Labs    07/16/22 1634  HGBA1C 7.8*   Lipid Profile No results for input(s): "CHOL", "HDL", "LDLCALC", "TRIG", "CHOLHDL", "LDLDIRECT" in the last 72 hours. Thyroid function studies No results for input(s): "TSH", "T4TOTAL", "T3FREE", "THYROIDAB" in the last 72 hours.  Invalid input(s): "FREET3" Anemia work up No results for input(s): "VITAMINB12", "FOLATE", "FERRITIN", "TIBC", "IRON", "RETICCTPCT" in the last 72 hours. Urinalysis    Component Value Date/Time   COLORURINE YELLOW 07/16/2022 0907   APPEARANCEUR CLEAR 07/16/2022 0907   LABSPEC >=1.030 07/16/2022 0907   PHURINE 5.5 07/16/2022 0907   GLUCOSEU NEGATIVE 07/16/2022 0907   HGBUR LARGE (A) 07/16/2022 0907   BILIRUBINUR NEGATIVE 07/16/2022 0907   BILIRUBINUR negative 08/27/2020 1840   KETONESUR NEGATIVE 07/16/2022 0907   PROTEINUR NEGATIVE 07/16/2022 0907   UROBILINOGEN 0.2 08/27/2020 1840   NITRITE NEGATIVE 07/16/2022 0907   LEUKOCYTESUR TRACE (A) 07/16/2022 0907   Sepsis Labs Recent Labs  Lab 07/16/22 0853 07/17/22 0429  WBC 5.3 8.1   Microbiology Recent Results (from the past 240 hour(s))  Blood culture (routine x 2)     Status: None (Preliminary result)   Collection Time: 07/16/22 12:45  PM   Specimen: Right Antecubital; Blood  Result Value Ref Range Status   Specimen Description   Final    RIGHT ANTECUBITAL BLOOD Performed at [facility], [address] [place], [place] [address]    Special Requests   Final    Blood Culture adequate volume BOTTLES DRAWN AEROBIC AND ANAEROBIC Performed at [facility], [address] [place], [place] [address]    Culture   Final    NO GROWTH < 24 HOURS Performed at [facility], [address], [place], [place] [address]    Report Status PENDING  Incomplete     Time coordinating discharge:  SIGNED:   Erick Blinks, MD  [facility] 07/17/2022, 1:24 PM   If 7PM-7AM, please contact night-coverage [email or web address]

## 2022-07-18 LAB — URINE CULTURE: Culture: NO GROWTH

## 2022-07-21 LAB — CULTURE, BLOOD (ROUTINE X 2)
Culture: NO GROWTH
Special Requests: ADEQUATE

## 2022-10-02 IMAGING — CT CT ABD-PELV W/ CM
2 of 4 series · 17 of 46 positions shown, 19 images · IV contrast (Omnipaque)
Comparison: None.

CLINICAL DATA: Abdominal pain.  Acute nonlocalized

EXAM:
CT ABDOMEN AND PELVIS WITH CONTRAST
TECHNIQUE: Multidetector CT imaging of the abdomen and pelvis was performed
using the standard protocol following bolus administration of
intravenous contrast.
CONTRAST:  100mL OMNIPAQUE IOHEXOL 300 MG/ML  SOLN

[Series 2: axial st · axial · 0.98mm/px · z∈[+376,+896]mm · 14 of 114 slices shown, 16 images]
[im 5/114  soft-tissue]
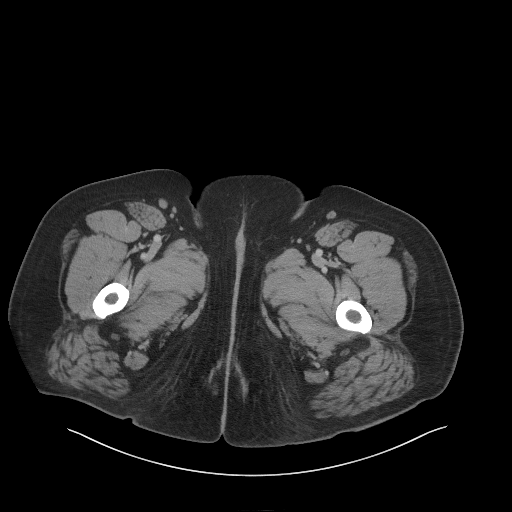
[im 5/114  bone]
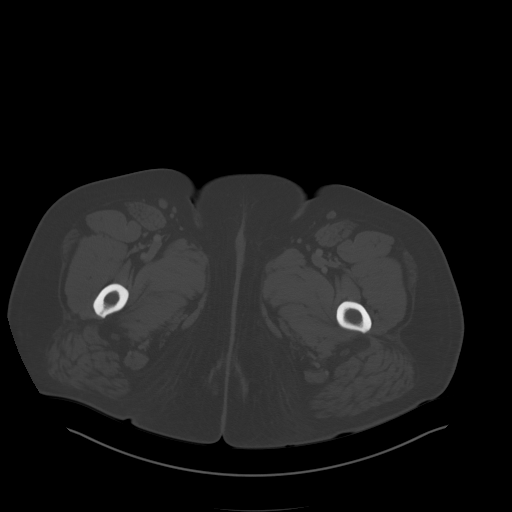
[im 14/114  soft-tissue]
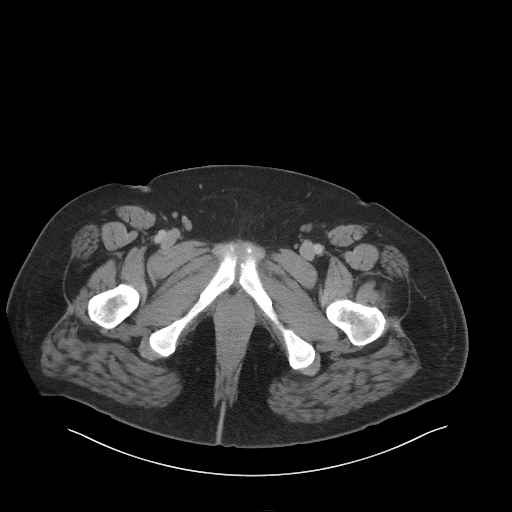
[im 23/114  soft-tissue]
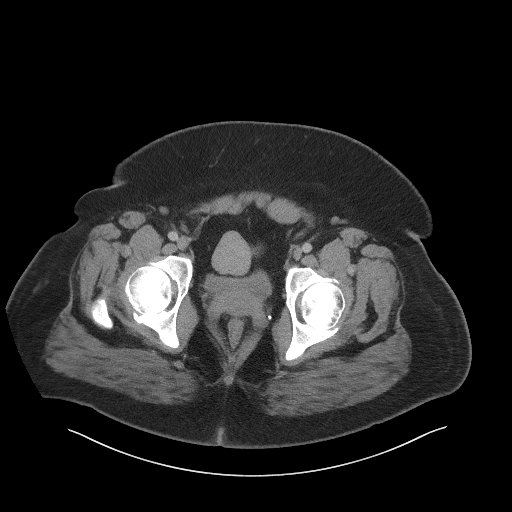
[im 32/114  soft-tissue]
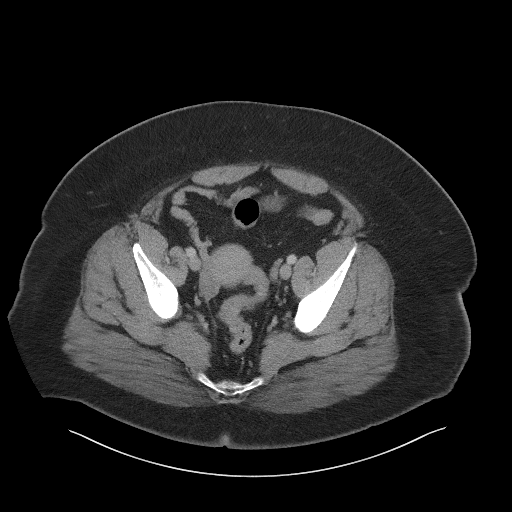
[im 37/114  soft-tissue]
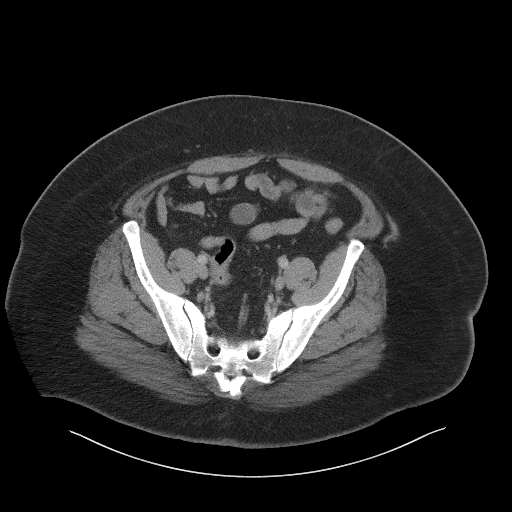
[im 46/114  soft-tissue]
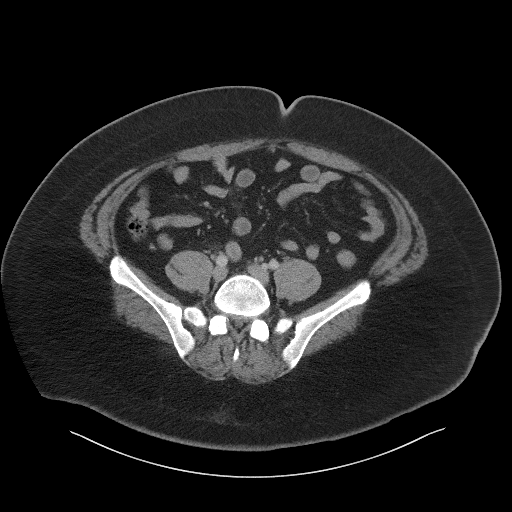
[im 55/114  soft-tissue]
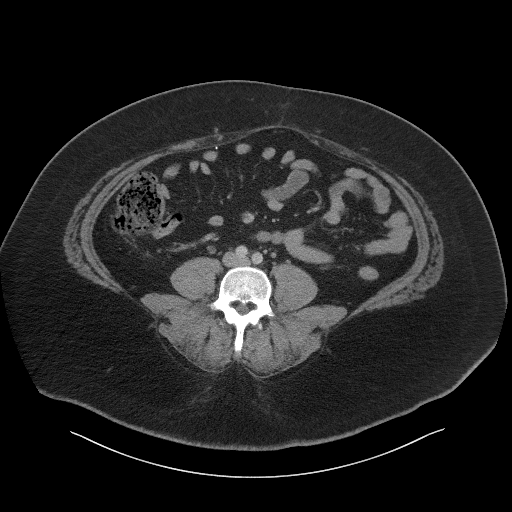
[im 59/114  soft-tissue]
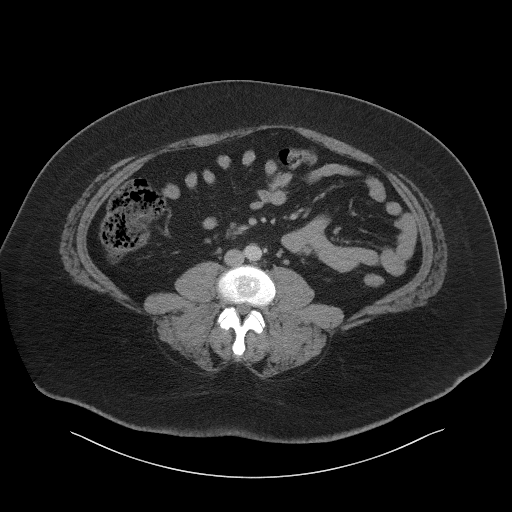
[im 68/114  soft-tissue]
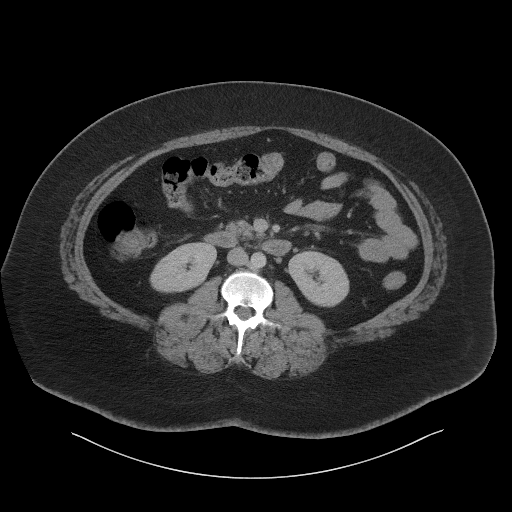
[im 68/114  bone]
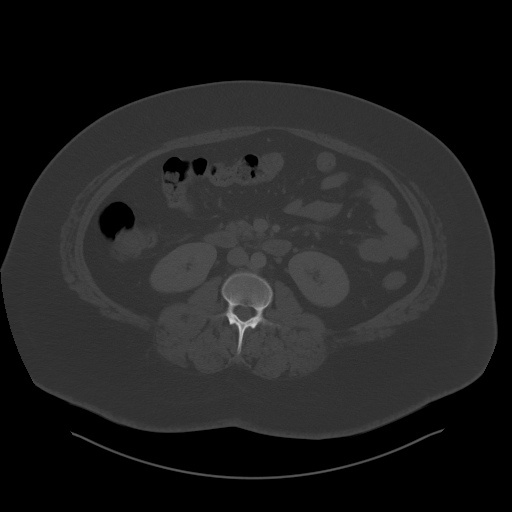
[im 77/114  soft-tissue]
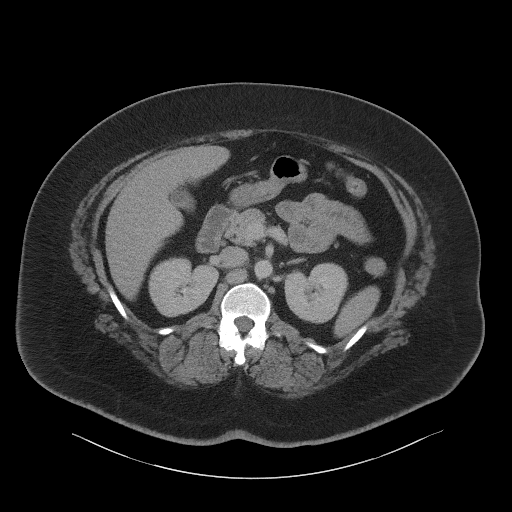
[im 86/114  soft-tissue]
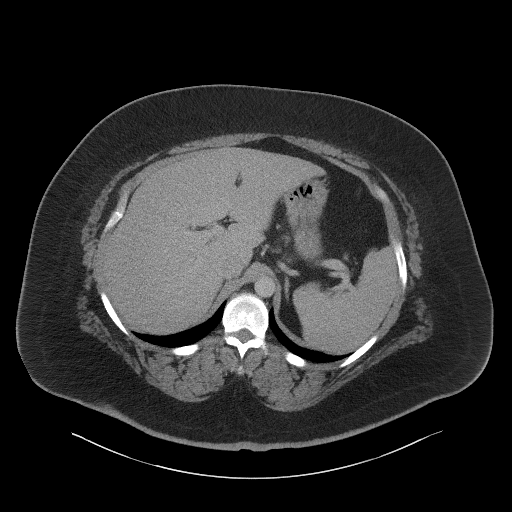
[im 91/114  soft-tissue]
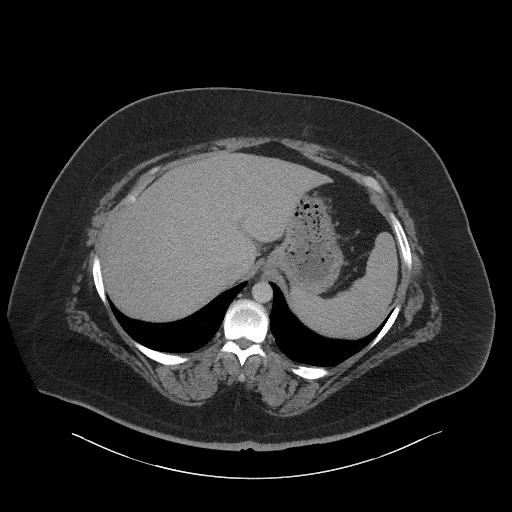
[im 100/114  soft-tissue]
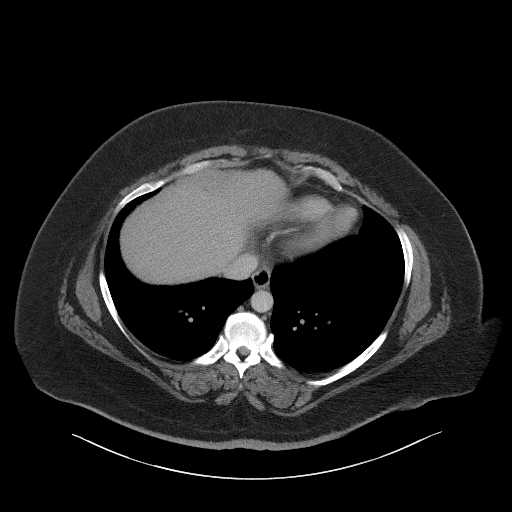
[im 109/114  soft-tissue]
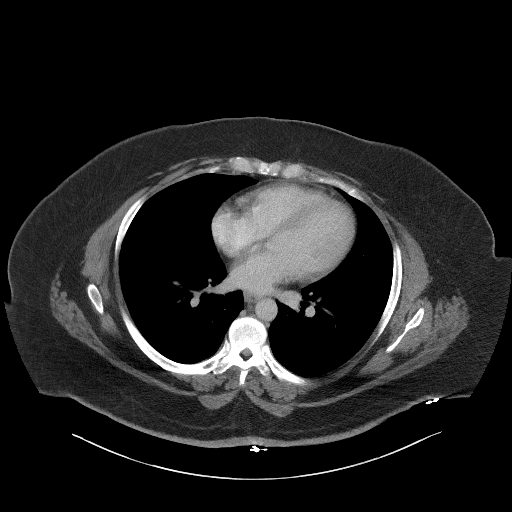

[Series 5: coronal st · coronal · 0.97mm/px · 3 of 117 slices shown]
[im 39/117  soft-tissue]
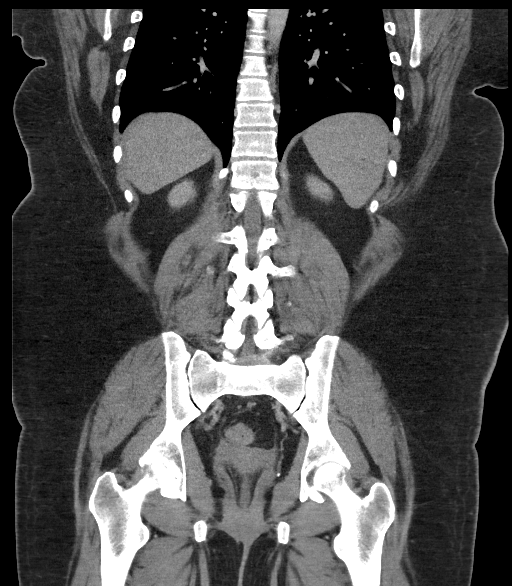
[im 52/117  soft-tissue]
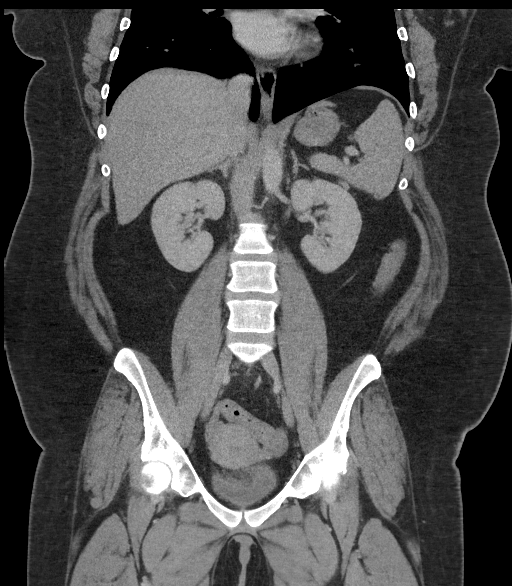
[im 65/117  soft-tissue]
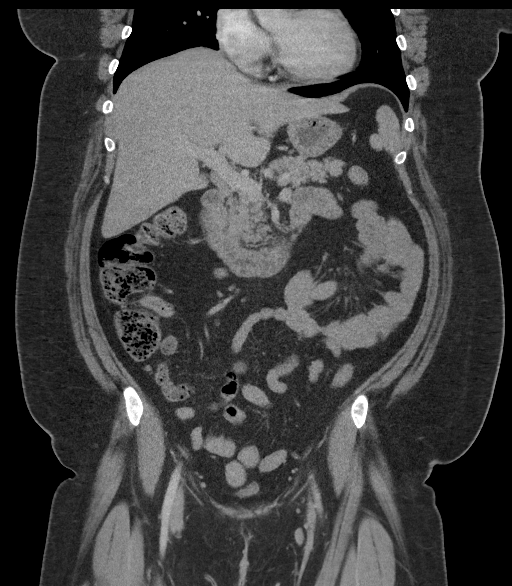

[17 of 46 positions shown; findings below may reference images not displayed]

FINDINGS: Lower chest: Lung bases are clear.

Hepatobiliary: No focal hepatic lesion. No biliary duct dilatation.
Common bile duct is normal.

Pancreas: Pancreas is normal. No ductal dilatation. No pancreatic
inflammation.

Spleen: Normal spleen

Adrenals/urinary tract: Adrenal glands and kidneys are normal. The
ureters and bladder normal.

Stomach/Bowel: Stomach, small bowel, appendix, and cecum are normal.
The colon and rectosigmoid colon are normal.

Vascular/Lymphatic: Abdominal aorta is normal caliber. No periportal
or retroperitoneal adenopathy. No pelvic adenopathy.

Reproductive: Uterus and ovaries are normal.

There is a rounded mass along the anterior wall of the uterus
measuring 4.1 by 3.0 cm in sagittal dimension (image 68/6). No fat
plane between this lesion and the uterus consistent with a exophytic
leiomyoma.

Other: No free fluid.

Musculoskeletal: No aggressive osseous lesion.
IMPRESSION: 1. No acute findings in the abdomen pelvis.
2. Normal gallbladder and appendix.
3. No obstructive uropathy.
4. Exophytic leiomyoma along the anterior margin of the uterus.

## 2022-11-26 ENCOUNTER — Ambulatory Visit
Admission: EM | Admit: 2022-11-26 | Discharge: 2022-11-26 | Disposition: A | Discharge status: Home / Self Care | Payer: PRIVATE HEALTH INSURANCE | Attending: Emergency Medicine | Admitting: Emergency Medicine

## 2022-11-26 DIAGNOSIS — T192XXA Foreign body in vulva and vagina, initial encounter: Secondary | ICD-10-CM | POA: Diagnosis not present

## 2022-11-26 DIAGNOSIS — R300 Dysuria: Secondary | ICD-10-CM | POA: Insufficient documentation

## 2022-11-26 DIAGNOSIS — X58XXXA Exposure to other specified factors, initial encounter: Secondary | ICD-10-CM | POA: Insufficient documentation

## 2022-11-26 DIAGNOSIS — Z113 Encounter for screening for infections with a predominantly sexual mode of transmission: Secondary | ICD-10-CM | POA: Insufficient documentation

## 2022-11-26 DIAGNOSIS — Z87448 Personal history of other diseases of urinary system: Secondary | ICD-10-CM | POA: Diagnosis present

## 2022-11-26 LAB — POCT URINALYSIS DIP (MANUAL ENTRY)
Bilirubin, UA: NEGATIVE
Blood, UA: NEGATIVE
Glucose, UA: NEGATIVE mg/dL
Ketones, POC UA: NEGATIVE mg/dL
Leukocytes, UA: NEGATIVE
Nitrite, UA: NEGATIVE
Protein Ur, POC: 30 mg/dL — AB
Spec Grav, UA: >=1.03 — AB (ref 1.010–1.025)
Urobilinogen, UA: 0.2 E.U./dL
pH, UA: 5.5 (ref 5.0–8.0)

## 2022-11-26 LAB — POCT URINE PREGNANCY: Preg Test, Ur: NEGATIVE

## 2022-11-26 MED ORDER — CEFTRIAXONE SODIUM 1 G IJ SOLR
1.0000 g | Freq: Once | INTRAMUSCULAR | Status: AC
  Administered 2022-11-26: 1 g via INTRAMUSCULAR

## 2022-11-26 NOTE — Discharge Instructions (Addendum)
As we discussed, your urinalysis today was not concerning for urinary tract infection but because you have a history of acute pyelonephritis requiring hospitalization in the past, having to treat you empirically with a high-dose antibiotic called ceftriaxone during your visit today which will value a little bit of time while we await the results of your urine culture.  You will be contacted by phone if your urine culture is positive and provided with an oral antibiotic to continue taking based on that result.  Please do go to the emergency room for further evaluation if you begin to feel worse or experience fever between now and when you receive the results of your urine culture.  If the results of your vaginal swab test will be posted to your MyChart account and if there are any positive findings, you will be contacted by phone and provided with treatment as needed.  If the condom in question does not work its way out in the next day or 2, I strongly encourage you to consider going to the emergency room to have it removed.  I am sorry that we do not have the appropriate instrument to do so here urgent care.  Thank you for visiting urgent care today.

## 2022-11-26 NOTE — ED Triage Notes (Signed)
Pt c/o nausea, vomiting, diarrhea and lower back pain on Friday. Currently she has frequent urination and lower back pain.   The patient states she believes there is a condom stuck inside vaginal canal (she reports her and her partner attempted to remove the condom but had no visual site of an object).

## 2022-11-26 NOTE — ED Provider Notes (Addendum)
[facility]    CSN: [id number] Arrival date & time: 11/26/22  1046    HISTORY   Chief Complaint  Patient presents with   Urinary Frequency   Back Pain   foreign object in vagina   HPI Kelly Sweeney is a pleasant, 31 y.o. female who presents to urgent care today. Pt c/o 24-hour history of nausea, vomiting, diarrhea and lower back pain 2 days ago which is now resolved.  Today, she noticed that she has had increased frequency of urination accompanied by lower back pain.  Patient states she has had this in the past, was checked for urinary tract infection and advised no treatment was needed however urine culture revealed presence of bacteria but by the time the culture was complete she was in the hospital with an elevated lactic acid level, fever and pyelonephritis.  Patient also states she believes there is a condom stuck inside vaginal canal.  After intercourse, her partner was not longer wearing the condom.  She reports her and her partner attempted to find and remove the condom from her vagina but could not see or feel it.  Patient states this happened before and the condom has fallen out on its own.  The history is provided by the patient.   Past Medical History:  Diagnosis Date   Diabetes mellitus without complication (HCC)    Hypertension    Patient Active Problem List   Diagnosis Date Noted   Obesity, Class III, BMI 40-49.9 (morbid obesity) (HCC) 07/17/2022   Dehydration 07/17/2022   Trichomoniasis 07/17/2022   Essential hypertension 07/17/2022   Type 2 diabetes mellitus (HCC) 07/16/2022   Lactic acidosis 07/16/2022   Yeast infection involving the vagina and surrounding area 08/28/2020   History reviewed. No pertinent surgical history. OB History   No obstetric history on file.    Home Medications    Prior to Admission medications   Medication Sig Start Date End Date Taking? Authorizing Provider  buPROPion (WELLBUTRIN SR) 100 MG 12 hr tablet Take 100 mg  by mouth 2 (two) times daily. 06/03/20   [provider]  escitalopram (LEXAPRO) 20 MG tablet Take 20 mg by mouth daily. 06/03/20   [provider]  ibuprofen (ADVIL) 800 MG tablet Take 1 tablet (800 mg total) by mouth every 6 (six) hours as needed for moderate pain. Patient not taking: Reported on 07/16/2022 01/04/22   Gilda CreasePollina, Christopher J, MD  lisinopril (ZESTRIL) 40 MG tablet Take 40 mg by mouth daily.    [provider]  metFORMIN (GLUCOPHAGE-XR) 500 MG 24 hr tablet Take 1,000 mg by mouth in the morning and at bedtime. 06/22/20   [provider]  rosuvastatin (CRESTOR) 20 MG tablet Take 20 mg by mouth daily. 02/18/20   [provider]  Semaglutide, 1 MG/DOSE, (OZEMPIC, 1 MG/DOSE,) 2 MG/1.5ML SOPN Inject 2 mg into the skin once a week. 08/03/20   [provider]    Family History History reviewed. No pertinent family history. Social History Social History   Tobacco Use   Smoking status: Never   Smokeless tobacco: Never  Vaping Use   Vaping Use: Never used  Substance Use Topics   Alcohol use: Never   Drug use: Not Currently   Allergies   Patient has no known allergies.  Review of Systems Review of Systems Pertinent findings revealed after performing a 14 point review of systems has been noted in the history of present illness.  Physical Exam Triage Vital Signs ED Triage Vitals  Enc Vitals Group     BP 09/23/21 0827 (!) 147/82     Pulse Rate 09/23/21 0827 72     Resp 09/23/21 0827 18     Temp 09/23/21 0827 98.3 F (36.8 C)     Temp Source 09/23/21 0827 Oral     SpO2 09/23/21 0827 98 %     Weight --      Height --      Head Circumference --      Peak Flow --      Pain Score 09/23/21 0826 5     Pain Loc --      Pain Edu? --      Excl. in GC? --   No data found.  Updated Vital Signs BP 107/75 (BP Location: Left Arm)   Pulse (!) 104   Temp 98 F (36.7 C) (Oral)   Resp 16   LMP 11/14/2022   SpO2 98%   Physical  Exam Vitals and nursing note reviewed.  Constitutional:      General: She is not in acute distress.    Appearance: Normal appearance. She is not ill-appearing.  HENT:     Head: Normocephalic and atraumatic.  Eyes:     General: Lids are normal.        Right eye: No discharge.        Left eye: No discharge.     Extraocular Movements: Extraocular movements intact.     Conjunctiva/sclera: Conjunctivae normal.     Right eye: Right conjunctiva is not injected.     Left eye: Left conjunctiva is not injected.  Neck:     Trachea: Trachea and phonation normal.  Cardiovascular:     Rate and Rhythm: Normal rate and regular rhythm.     Pulses: Normal pulses.     Heart sounds: Normal heart sounds. No murmur heard.    No friction rub. No gallop.  Pulmonary:     Effort: Pulmonary effort is normal. No accessory muscle usage, prolonged expiration or respiratory distress.     Breath sounds: Normal breath sounds. No stridor, decreased air movement or transmitted upper airway sounds. No decreased breath sounds, wheezing, rhonchi or rales.  Chest:     Chest wall: No tenderness.  Abdominal:     General: Abdomen is flat. Bowel sounds are normal. There is no distension.     Palpations: Abdomen is soft.     Tenderness: There is no abdominal tenderness. There is no right CVA tenderness or left CVA tenderness.     Hernia: No hernia is present.  Genitourinary:    Comments: Patient politely declines pelvic exam today, patient provided a vaginal swab for testing. Musculoskeletal:        General: Normal range of motion.     Cervical back: Normal range of motion and neck supple. Normal range of motion.  Lymphadenopathy:     Cervical: No cervical adenopathy.  Skin:    General: Skin is warm and dry.     Findings: No erythema or rash.  Neurological:     General: No focal deficit present.     Mental Status: She is alert and oriented to person, place, and time.  Psychiatric:        Mood and Affect: Mood  normal.        Behavior: Behavior normal.     Visual Acuity Right Eye Distance:   Left Eye Distance:   Bilateral Distance:    Right Eye Near:   Left Eye Near:  Bilateral Near:     UC Couse / Diagnostics / Procedures:     Radiology No results found.  Procedures Procedures (including critical care time) EKG  Pending results:  Labs Reviewed  POCT URINALYSIS DIP (MANUAL ENTRY) - Abnormal; Notable for the following components:      Result Value   Spec Grav, UA >=1.030 (*)    Protein Ur, POC =30 (*)    All other components within normal limits  URINE CULTURE  POCT URINE PREGNANCY  CERVICOVAGINAL ANCILLARY ONLY    Medications Ordered in UC: Medications  cefTRIAXone (ROCEPHIN) injection 1 g (has no administration in time range)    UC Diagnoses / Final Clinical Impressions(s)   I have reviewed the triage vital signs and the nursing notes.  Pertinent labs & imaging results that were available during my care of the patient were reviewed by me and considered in my medical decision making (see chart for details).    Final diagnoses:  Dysuria  History of acute pyelonephritis  Vaginal foreign object, initial encounter   Urine pregnancy test today is negative.  Urinalysis at this time is unremarkable for acute cystitis.  Given patient's history of pyelonephritis, will treat patient empirically with ceftriaxone 1 g during her visit today and send her urine for culture.  Once urine culture is complete, we will provide p.o. antibiotics as needed based on the results of the culture.    Please see discharge instructions below for details of plan of care as provided to patient. ED Prescriptions   None    PDMP not reviewed this encounter.  Disposition Upon Discharge:  Condition: stable for discharge home  Patient presented with concern for an acute illness with associated systemic symptoms and significant discomfort requiring urgent management. In my opinion, this is a  condition that a prudent lay person (someone who possesses an average knowledge of health and medicine) may potentially expect to result in complications if not addressed urgently such as respiratory distress, impairment of bodily function or dysfunction of bodily organs.   As such, the patient has been evaluated and assessed, work-up was performed and treatment was provided in alignment with urgent care protocols and evidence based medicine.  Patient/parent/caregiver has been advised that the patient may require follow up for further testing and/or treatment if the symptoms continue in spite of treatment, as clinically indicated and appropriate.  Routine symptom specific, illness specific and/or disease specific instructions were discussed with the patient and/or caregiver at length.  Prevention strategies for avoiding STD exposure were also discussed.  The patient will follow up with their current PCP if and as advised. If the patient does not currently have a PCP we will assist them in obtaining one.   The patient may need specialty follow up if the symptoms continue, in spite of conservative treatment and management, for further workup, evaluation, consultation and treatment as clinically indicated and appropriate.  Patient/parent/caregiver verbalized understanding and agreement of plan as discussed.  All questions were addressed during visit.  Please see discharge instructions below for further details of plan.  Discharge Instructions:   Discharge Instructions      As we discussed, your urinalysis today was not concerning for urinary tract infection but because you have a history of acute pyelonephritis requiring hospitalization in the past, having to treat you empirically with a high-dose antibiotic called ceftriaxone during your visit today which will value a little bit of time while we await the results of your urine culture.  You will be  contacted by phone if your urine culture is  positive and provided with an oral antibiotic to continue taking based on that result.  Please do go to the emergency room for further evaluation if you begin to feel worse or experience fever between now and when you receive the results of your urine culture.  If the results of your vaginal swab test will be posted to your MyChart account and if there are any positive findings, you will be contacted by phone and provided with treatment as needed.  If the condom in question does not work its way out in the next day or 2, I strongly encourage you to consider going to the emergency room to have it removed.  I am sorry that we do not have the appropriate instrument to do so here urgent care.  Thank you for visiting urgent care today.        This office note has been dictated using Teaching laboratory technician.  Unfortunately, this method of dictation can sometimes lead to typographical or grammatical errors.  I apologize for your inconvenience in advance if this occurs.  Please do not hesitate to reach out to me if clarification is needed.       Theadora Rama Scales, PA-C 11/26/22 1410    Theadora Rama [place], PA-C 11/26/22 1411    Theadora Rama Scales, PA-C 11/26/22 [phone]

## 2022-11-27 LAB — URINE CULTURE: Culture: <10000 — AB

## 2022-11-28 LAB — CERVICOVAGINAL ANCILLARY ONLY
Bacterial Vaginitis (gardnerella): NEGATIVE
Candida Glabrata: NEGATIVE
Candida Vaginitis: NEGATIVE
Chlamydia: NEGATIVE
Comment: NEGATIVE
Comment: NEGATIVE
Comment: NEGATIVE
Comment: NEGATIVE
Comment: NEGATIVE
Comment: NORMAL
Neisseria Gonorrhea: NEGATIVE
Trichomonas: NEGATIVE

## 2023-01-30 ENCOUNTER — Emergency Department (HOSPITAL_COMMUNITY): Payer: PRIVATE HEALTH INSURANCE

## 2023-01-30 ENCOUNTER — Encounter (HOSPITAL_COMMUNITY): Payer: Self-pay

## 2023-01-30 ENCOUNTER — Other Ambulatory Visit: Payer: Self-pay

## 2023-01-30 ENCOUNTER — Emergency Department (HOSPITAL_COMMUNITY)
Admission: EM | Admit: 2023-01-30 | Discharge: 2023-01-30 | Disposition: A | Discharge status: Home / Self Care | Payer: PRIVATE HEALTH INSURANCE | Attending: Emergency Medicine | Admitting: Emergency Medicine

## 2023-01-30 DIAGNOSIS — Z79899 Other long term (current) drug therapy: Secondary | ICD-10-CM | POA: Insufficient documentation

## 2023-01-30 DIAGNOSIS — I10 Essential (primary) hypertension: Secondary | ICD-10-CM | POA: Diagnosis not present

## 2023-01-30 DIAGNOSIS — Z794 Long term (current) use of insulin: Secondary | ICD-10-CM | POA: Insufficient documentation

## 2023-01-30 DIAGNOSIS — E119 Type 2 diabetes mellitus without complications: Secondary | ICD-10-CM | POA: Insufficient documentation

## 2023-01-30 DIAGNOSIS — R519 Headache, unspecified: Secondary | ICD-10-CM

## 2023-01-30 DIAGNOSIS — Z7984 Long term (current) use of oral hypoglycemic drugs: Secondary | ICD-10-CM | POA: Insufficient documentation

## 2023-01-30 LAB — CBC
HCT: 35.4 % — ABNORMAL LOW (ref 36.0–46.0)
Hemoglobin: 11 g/dL — ABNORMAL LOW (ref 12.0–15.0)
MCH: 24.2 pg — ABNORMAL LOW (ref 26.0–34.0)
MCHC: 31.1 g/dL (ref 30.0–36.0)
MCV: 77.8 fL — ABNORMAL LOW (ref 80.0–100.0)
Platelets: 256 10*3/uL (ref 150–400)
RBC: 4.55 MIL/uL (ref 3.87–5.11)
RDW: 14.3 % (ref 11.5–15.5)
WBC: 5.9 10*3/uL (ref 4.0–10.5)
nRBC: 0 % (ref 0.0–0.2)

## 2023-01-30 LAB — BASIC METABOLIC PANEL
Anion gap: 9 (ref 5–15)
BUN: 10 mg/dL (ref 6–20)
CO2: 21 mmol/L — ABNORMAL LOW (ref 22–32)
Calcium: 9.2 mg/dL (ref 8.9–10.3)
Chloride: 106 mmol/L (ref 98–111)
Creatinine, Ser: 0.83 mg/dL (ref 0.44–1.00)
GFR, Estimated: >60 mL/min (ref >60)
Glucose, Bld: 204 mg/dL — ABNORMAL HIGH (ref 70–99)
Potassium: 4 mmol/L (ref 3.5–5.1)
Sodium: 136 mmol/L (ref 135–145)

## 2023-01-30 LAB — URINALYSIS, ROUTINE W REFLEX MICROSCOPIC
Bilirubin Urine: NEGATIVE
Glucose, UA: NEGATIVE mg/dL
Hgb urine dipstick: NEGATIVE
Ketones, ur: 5 mg/dL — AB
Leukocytes,Ua: NEGATIVE
Nitrite: NEGATIVE
Protein, ur: 30 mg/dL — AB
Specific Gravity, Urine: 1.027 (ref 1.005–1.030)
pH: 6 (ref 5.0–8.0)

## 2023-01-30 LAB — I-STAT BETA HCG BLOOD, ED (MC, WL, AP ONLY): I-stat hCG, quantitative: <5 m[IU]/mL (ref <5)

## 2023-01-30 LAB — PREGNANCY, URINE: Preg Test, Ur: NEGATIVE

## 2023-01-30 LAB — TROPONIN I (HIGH SENSITIVITY): Troponin I (High Sensitivity): 3 ng/L (ref <18)

## 2023-01-30 MED ORDER — METOCLOPRAMIDE HCL 10 MG PO TABS
10.0000 mg | ORAL_TABLET | Freq: Once | ORAL | Status: AC
  Administered 2023-01-30: 10 mg via ORAL
  Filled 2023-01-30: qty 1

## 2023-01-30 MED ORDER — LISINOPRIL 40 MG PO TABS: 40.0000 mg | ORAL_TABLET | Freq: Every day | ORAL | 0 refills | Status: DC

## 2023-01-30 MED ORDER — LISINOPRIL 40 MG PO TABS: 40.0000 mg | ORAL_TABLET | Freq: Every day | ORAL | 0 refills | Status: AC

## 2023-01-30 MED ORDER — KETOROLAC TROMETHAMINE 30 MG/ML IJ SOLN
15.0000 mg | Freq: Once | INTRAMUSCULAR | Status: AC
  Administered 2023-01-30: 15 mg via INTRAMUSCULAR
  Filled 2023-01-30: qty 1

## 2023-01-30 NOTE — ED Triage Notes (Addendum)
Pt arrived via [facility] from home for c/o HTN and Hax2d. Pt has had tingling and tightness in left armx2d.  Pt states the left arm is worse today. Pt has not had bp medsx1wk. Pt states she was late requesting for a refill for her bp meds and it is mail ordered. Pt denies chest pain or SOB. Per EMS, initial bp was 190/120 manual and second was 160/110 manual.

## 2023-01-30 NOTE — ED Provider Triage Note (Signed)
Emergency Medicine Provider Triage Evaluation Note  Kelly Sweeney , a 32 y.o. female  was evaluated in triage.  Pt complains of headache and hypertension.  States she has had a headache for 2 days with intermittent tingling and numbness on the left arm.  Denies chest pain or shortness of breath.  Patient has been running out of her high blood pressure medication for a week.  Denies fever, nausea, vomiting, bowel changes, urinary symptoms..  Review of Systems  Positive: As above Negative: As above  Physical Exam  BP (!) 144/90   Pulse 98   Temp 98.4 F (36.9 C) (Oral)   Resp 16   Ht '5\' 9"'$  (1.753 m)   Wt 131.1 kg   SpO2 100%   BMI 42.68 kg/m  Gen:   Awake, no distress   Resp:  Normal effort  MSK:   Moves extremities without difficulty  Other:    Medical Decision Making  Medically screening exam initiated at 3:44 PM.  Appropriate orders placed.  Kelly Sweeney was informed that the remainder of the evaluation will be completed by another provider, this initial triage assessment does not replace that evaluation, and the importance of remaining in the ED until their evaluation is complete.    Kelly Sweeney, [place] 01/30/23 1545

## 2023-01-30 NOTE — Discharge Instructions (Addendum)
You were seen today for a headache after high blood pressure.  Your blood pressure normalized upon arrival at the emergency department.  Your headache improved with medication administration.  This may have been a tension type headache.  Please continue take over-the-counter medications as needed for headache control.  As requested I have sent 5 tablets of lisinopril to your pharmacy as a bridge until your mail order medications arrive.  Once your mail order medications arrive be sure to not take "double" the medication any day.  Please follow-up as needed with your primary care provider.

## 2023-01-30 NOTE — ED Notes (Signed)
Patient verbalizes understanding of discharge instructions. Opportunity for questioning and answers were provided. Armband removed by staff, pt discharged from ED. Pt ambulatory to ED waiting room with steady gait.  

## 2023-01-30 NOTE — ED Provider Notes (Signed)
[facility] Provider Note   CSN: [id number] Arrival date & time: 01/30/23  1519     History  Chief Complaint  Patient presents with   Hypertension   Headache    Kelly Sweeney is a 32 y.o. female.  Patient presents the emergency department via EMS complaining of 2 days of headache and high blood pressure.  She states that she has been having some intermittent tingling/pain in the left arm.  The patient takes 40 mg of lisinopril daily but was late requesting a refill in her medications or mail ordered.  She states she has been unable to take them for the past week.  She denies chest pain, shortness of breath.  EMS states that her initial blood pressure was 190/120 with a second BP at 160/110 manual.  Past medical history significant for type II DM, uncontrolled, hypertension  HPI     Home Medications Prior to Admission medications   Medication Sig Start Date End Date Taking? Authorizing Provider  lisinopril (ZESTRIL) 40 MG tablet Take 1 tablet (40 mg total) by mouth daily. 01/30/23  Yes Dorothyann Peng, PA-C  buPROPion (WELLBUTRIN SR) 100 MG 12 hr tablet Take 100 mg by mouth 2 (two) times daily. 06/03/20   [provider]  escitalopram (LEXAPRO) 20 MG tablet Take 20 mg by mouth daily. 06/03/20   [provider]  HUMALOG KWIKPEN 100 UNIT/ML KwikPen Inject into the skin 3 (three) times daily. 08/30/22   [provider]  LEVEMIR FLEXPEN 100 UNIT/ML FlexPen Inject into the skin. 08/30/22   [provider]  lisinopril (ZESTRIL) 40 MG tablet Take 40 mg by mouth daily.    [provider]  metFORMIN (GLUCOPHAGE-XR) 500 MG 24 hr tablet Take 1,000 mg by mouth in the morning and at bedtime. 06/22/20   [provider]  pantoprazole (PROTONIX) 20 MG tablet Take 1 tablet by mouth daily. 10/24/22   [provider]  rosuvastatin (CRESTOR) 20 MG tablet Take 20 mg by mouth daily. 02/18/20   [provider]  Semaglutide, 1 MG/DOSE, (OZEMPIC, 1 MG/DOSE,) 2 MG/1.5ML SOPN Inject 2 mg into the skin once a week. 08/03/20   [provider]      Allergies    Patient has no known allergies.    Review of Systems   Review of Systems  Constitutional:  Negative for fever.  Respiratory:  Negative for shortness of breath.   Cardiovascular:  Negative for chest pain.  Neurological:  Positive for headaches.    Physical Exam Updated Vital Signs BP (!) 146/92 (BP Location: Left Arm)   Pulse 93   Temp 98.4 F (36.9 C) (Oral)   Resp 14   Ht '5\' 9"'$  (1.753 m)   Wt 131.1 kg   SpO2 100%   BMI 42.68 kg/m  Physical Exam Vitals and nursing note reviewed.  Constitutional:      General: She is not in acute distress.    Appearance: She is well-developed.  HENT:     Head: Normocephalic and atraumatic.  Eyes:     Extraocular Movements: Extraocular movements intact.     Conjunctiva/sclera: Conjunctivae normal.  Cardiovascular:     Rate and Rhythm: Normal rate and regular rhythm.     Heart sounds: No murmur heard. Pulmonary:     Effort: Pulmonary effort is normal. No respiratory distress.     Breath sounds: Normal breath sounds.  Abdominal:     Palpations: Abdomen is soft.  Tenderness: There is no abdominal tenderness.  Musculoskeletal:        General: No swelling. Normal range of motion.     Cervical back: Neck supple.  Skin:    General: Skin is warm and dry.     Capillary Refill: Capillary refill takes less than 2 seconds.  Neurological:     Mental Status: She is alert.     Comments: No focal deficit noted  Psychiatric:        Mood and Affect: Mood normal.     ED Results / Procedures / Treatments   Labs (all labs ordered are listed, but only abnormal results are displayed) Labs Reviewed  BASIC METABOLIC PANEL - Abnormal; Notable for the following components:      Result Value   CO2 21 (*)    Glucose, Bld 204 (*)    All other components within normal limits   CBC - Abnormal; Notable for the following components:   Hemoglobin 11.0 (*)    HCT 35.4 (*)    MCV 77.8 (*)    MCH 24.2 (*)    All other components within normal limits  URINALYSIS, ROUTINE W REFLEX MICROSCOPIC - Abnormal; Notable for the following components:   APPearance HAZY (*)    Ketones, ur 5 (*)    Protein, ur 30 (*)    Bacteria, UA RARE (*)    All other components within normal limits  PREGNANCY, URINE  I-STAT BETA HCG BLOOD, ED ([facility], [facility], [facility] ONLY)  TROPONIN I (HIGH SENSITIVITY)    EKG None  Radiology DG Chest 2 View  Result Date: 01/30/2023 CLINICAL DATA:  HTN EXAM: CHEST - 2 VIEW COMPARISON:  09/01/2021 FINDINGS: Cardiac silhouette is unremarkable. No pneumothorax or pleural effusion. The lungs are clear. The visualized skeletal structures are unremarkable. IMPRESSION: No acute cardiopulmonary process. Electronically Signed   By: Sammie Bench M.D.   On: 01/30/2023 16:19    Procedures Procedures    Medications Ordered in ED Medications  metoCLOPramide (REGLAN) tablet 10 mg (10 mg Oral Given 01/30/23 1821)  ketorolac (TORADOL) 30 MG/ML injection 15 mg (15 mg Intramuscular Given 01/30/23 1822)    ED Course/ Medical Decision Making/ A&P                             Medical Decision Making Amount and/or Complexity of Data Reviewed Labs: ordered. Radiology: ordered.  Risk Prescription drug management.   This patient presents to the ED for concern of headache and hypertension, this involves an extensive number of treatment options, and is a complaint that carries with it a high risk of complications and morbidity.  The differential diagnosis includes hypertensive urgency, tension headache, migraine, others   Co morbidities that complicate the patient evaluation  Hypertension, uncontrolled type II DM   Additional history obtained:  Additional history obtained from EMS   Lab Tests:  I Ordered, and personally interpreted labs.  The pertinent results  include: UA not indicative of infection, negative pregnancy test, troponin 3, glucose 204, grossly unremarkable CBC   Imaging Studies ordered:  I ordered imaging studies including chest x-ray I independently visualized and interpreted imaging which showed no acute cardiopulmonary process I agree with the radiologist interpretation   Cardiac Monitoring: / EKG:  The patient was maintained on a cardiac monitor.  I personally viewed and interpreted the cardiac monitored which showed an underlying rhythm of: Normal sinus rhythm   Problem List / ED Course / Critical  interventions / Medication management   I ordered medication including Toradol and Reglan for headache Reevaluation of the patient after these medicines showed that the patient improved I have reviewed the patients home medicines and have made adjustments as needed   Test / Admission - Considered:  The patient's headache improved with medication administration.  No red flag symptoms such as sudden onset or worst headache of life.  The patient's blood pressure normalized upon arrival at the emergency department.  The headache may be related to low blood pressure but unclear at this time.  Patient requested 5 days of medication to get her through until mail order was able to ship her medication to her.  This is reasonable.  Lisinopril prescription sent to the pharmacy.  Return precautions provided.        Final Clinical Impression(s) / ED Diagnoses Final diagnoses:  Bad headache  Hypertension, unspecified type    Rx / DC Orders ED Discharge Orders          Ordered    lisinopril (ZESTRIL) 40 MG tablet  Daily        01/30/23 1921              Ronny Bacon 01/30/23 Jac Canavan, MD 01/31/23 [phone]

## 2023-04-28 IMAGING — CT CT RENAL STONE PROTOCOL
2 of 4 series · 17 of 46 positions shown, 19 images · non-contrast
Comparison: CT abdomen and pelvis 06/10/2021.  CT chest 09/02/2021

CLINICAL DATA: Bilateral flank pain and hematuria for 5 days.
Kidney stones suspected.



[Series 3: axial st · axial · 0.98mm/px · z∈[-571,-96]mm · 14 of 105 slices shown, 16 images]
[im 5/105  soft-tissue]
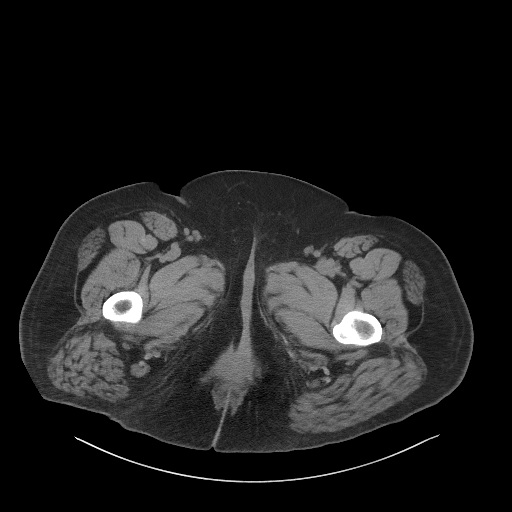
[im 5/105  bone]
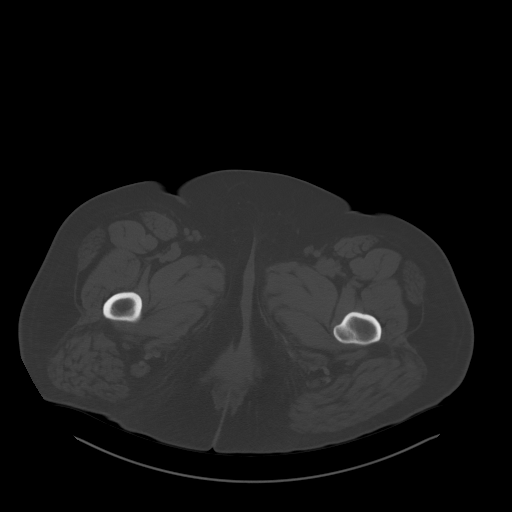
[im 14/105  soft-tissue]
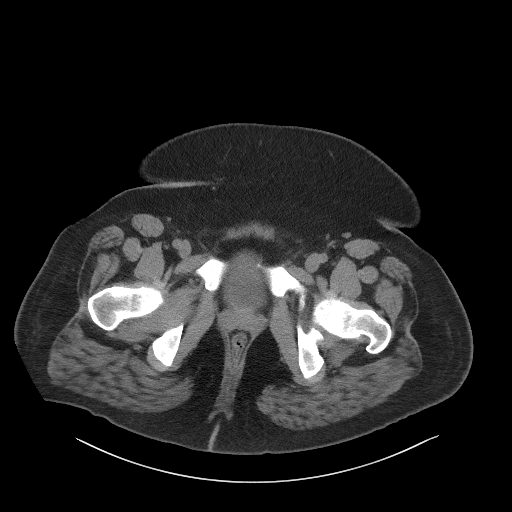
[im 22/105  soft-tissue]
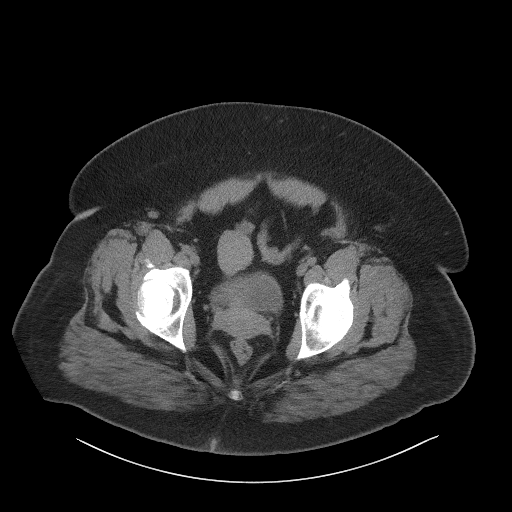
[im 27/105  soft-tissue]
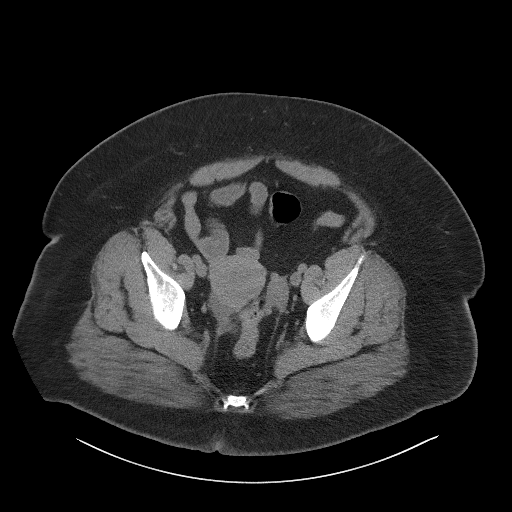
[im 35/105  soft-tissue]
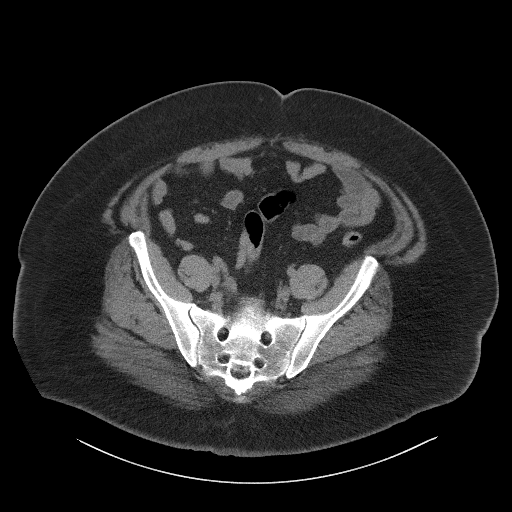
[im 44/105  soft-tissue]
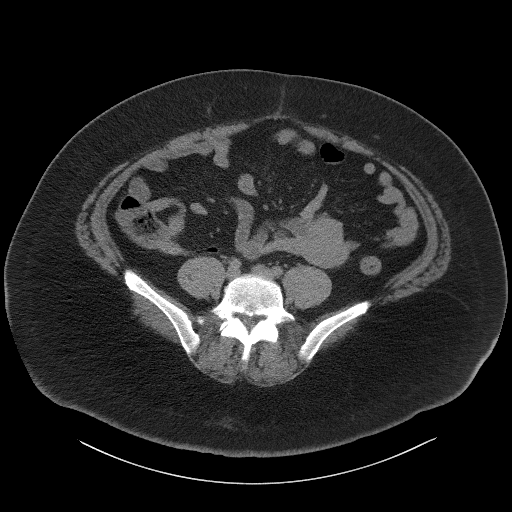
[im 48/105  soft-tissue]
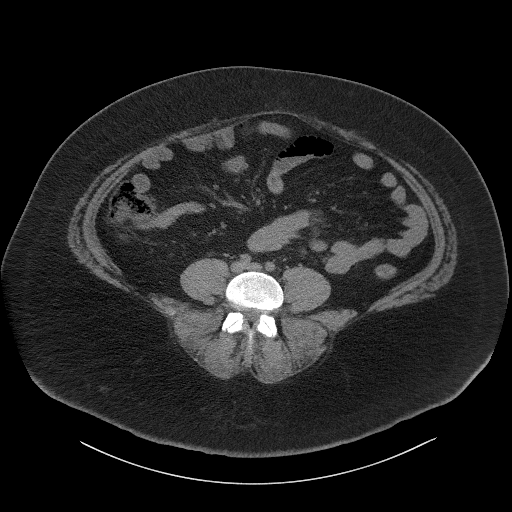
[im 57/105  soft-tissue]
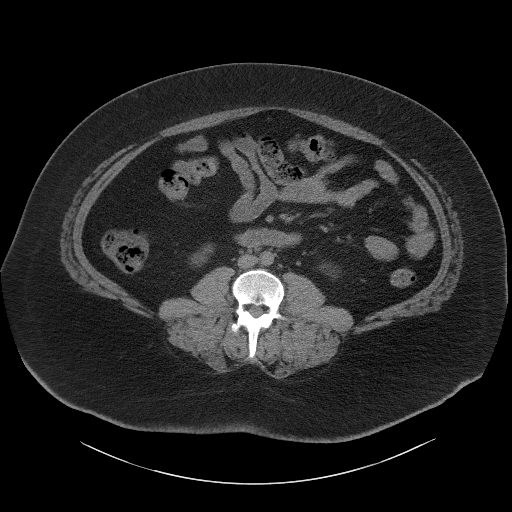
[im 61/105  soft-tissue]
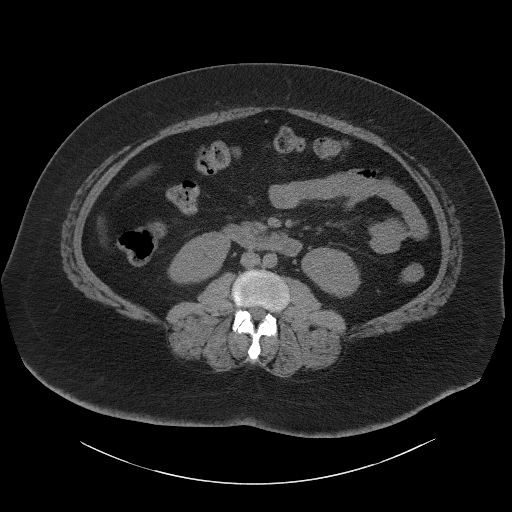
[im 61/105  bone]
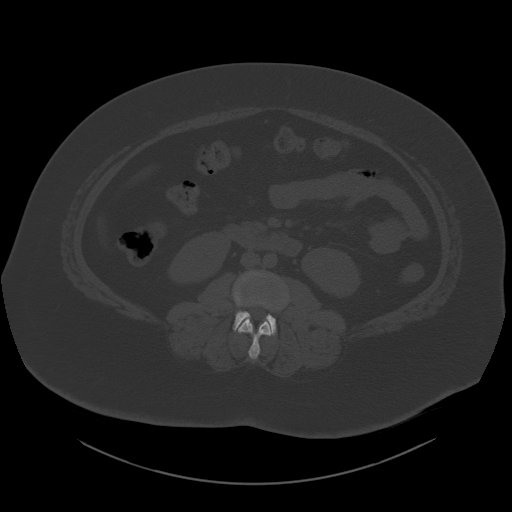
[im 70/105  soft-tissue]
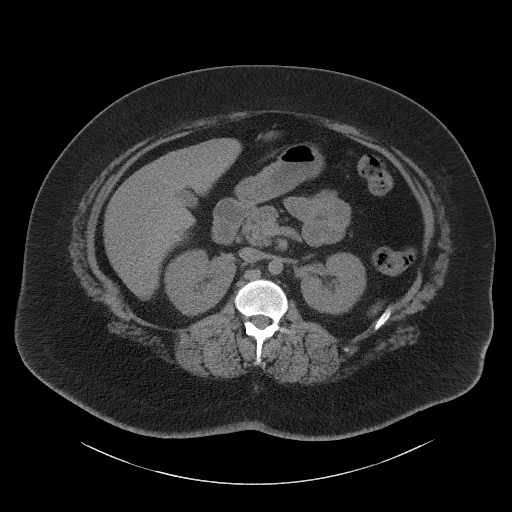
[im 79/105  soft-tissue]
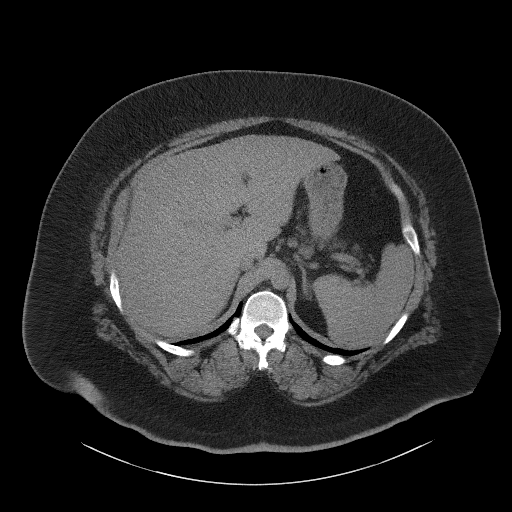
[im 83/105  soft-tissue]
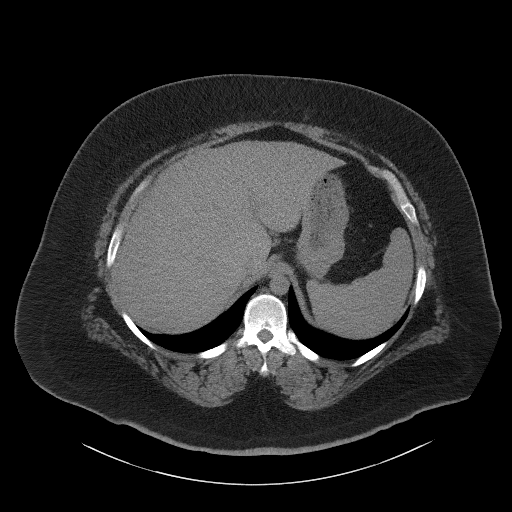
[im 92/105  soft-tissue]
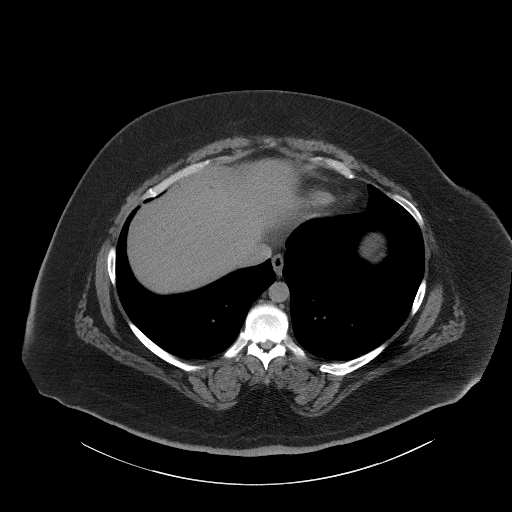
[im 100/105  soft-tissue]
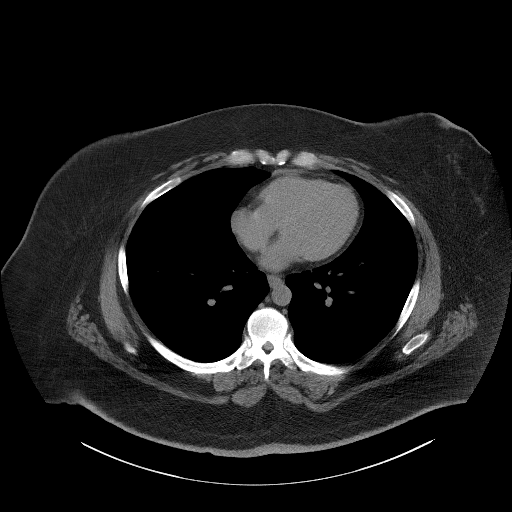

[Series 6: coronal st · coronal · 1.00mm/px · 3 of 108 slices shown]
[im 36/108  soft-tissue]
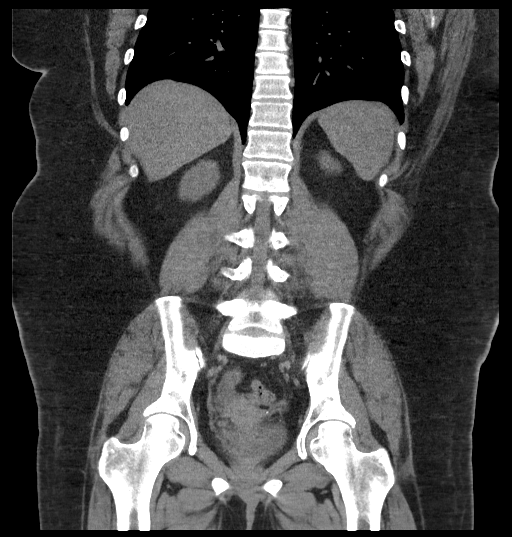
[im 48/108  soft-tissue]
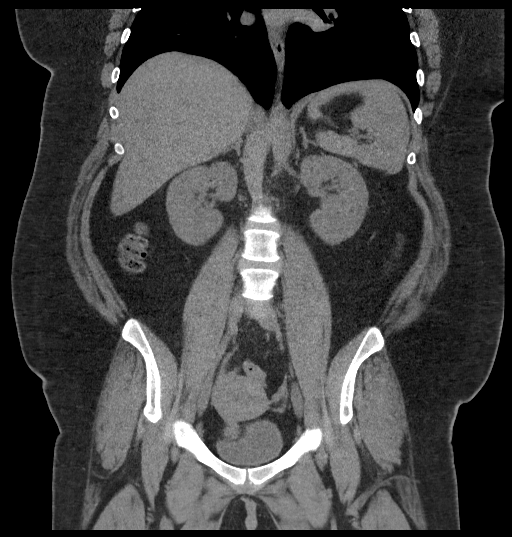
[im 60/108  soft-tissue]
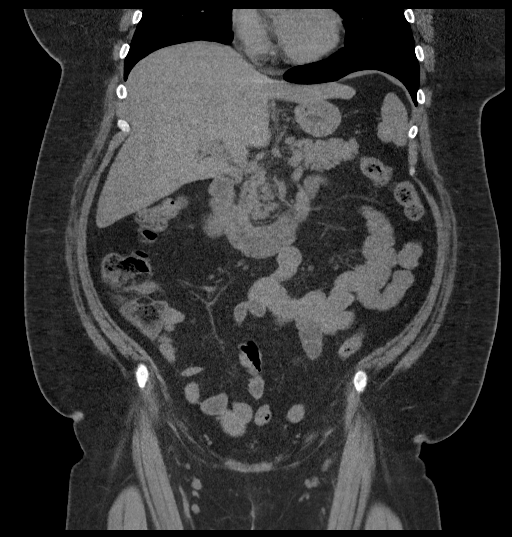

[17 of 46 positions shown; findings below may reference images not displayed]

FINDINGS: Lower chest: The lung bases are clear.

Hepatobiliary: No focal liver abnormality is seen. No gallstones,
gallbladder wall thickening, or biliary dilatation.

Pancreas: Unremarkable. No pancreatic ductal dilatation or
surrounding inflammatory changes.

Spleen: Normal in size without focal abnormality.

Adrenals/Urinary Tract: Adrenal glands are unremarkable. Kidneys are
normal, without renal calculi, focal lesion, or hydronephrosis.
Bladder is unremarkable.

Stomach/Bowel: Stomach is within normal limits. Appendix appears
normal. No evidence of bowel wall thickening, distention, or
inflammatory changes.

Vascular/Lymphatic: No significant vascular findings are present. No
enlarged abdominal or pelvic lymph nodes.

Reproductive: Uterus and bilateral adnexa are unremarkable. Minimal
free fluid in the pelvis is likely physiologic.

Other: No free air in the abdomen. Abdominal wall musculature
appears intact.

Musculoskeletal: No acute or significant osseous findings.
IMPRESSION: No renal or ureteral stone or obstruction. No acute process
demonstrated in the abdomen or pelvis.

## 2023-06-29 ENCOUNTER — Encounter (HOSPITAL_BASED_OUTPATIENT_CLINIC_OR_DEPARTMENT_OTHER): Payer: Self-pay | Admitting: Emergency Medicine

## 2023-06-29 ENCOUNTER — Other Ambulatory Visit: Payer: Self-pay

## 2023-06-29 ENCOUNTER — Emergency Department (HOSPITAL_BASED_OUTPATIENT_CLINIC_OR_DEPARTMENT_OTHER): Payer: PRIVATE HEALTH INSURANCE

## 2023-06-29 ENCOUNTER — Emergency Department (HOSPITAL_BASED_OUTPATIENT_CLINIC_OR_DEPARTMENT_OTHER)
Admission: EM | Admit: 2023-06-29 | Discharge: 2023-06-29 | Disposition: A | Discharge status: Home / Self Care | Payer: PRIVATE HEALTH INSURANCE | Attending: Emergency Medicine | Admitting: Emergency Medicine

## 2023-06-29 DIAGNOSIS — R109 Unspecified abdominal pain: Secondary | ICD-10-CM | POA: Diagnosis present

## 2023-06-29 DIAGNOSIS — D259 Leiomyoma of uterus, unspecified: Secondary | ICD-10-CM | POA: Diagnosis not present

## 2023-06-29 DIAGNOSIS — D219 Benign neoplasm of connective and other soft tissue, unspecified: Secondary | ICD-10-CM

## 2023-06-29 LAB — URINALYSIS, ROUTINE W REFLEX MICROSCOPIC
Bilirubin Urine: NEGATIVE
Glucose, UA: NEGATIVE mg/dL
Hgb urine dipstick: NEGATIVE
Ketones, ur: NEGATIVE mg/dL
Leukocytes,Ua: NEGATIVE
Nitrite: NEGATIVE
Protein, ur: NEGATIVE mg/dL
Specific Gravity, Urine: 1.02 (ref 1.005–1.030)
pH: 7 (ref 5.0–8.0)

## 2023-06-29 LAB — PREGNANCY, URINE: Preg Test, Ur: NEGATIVE

## 2023-06-29 NOTE — ED Provider Notes (Signed)
Kelly Sweeney EMERGENCY DEPARTMENT AT [facility] Provider Note   CSN: [id number] Arrival date & time: 06/29/23  5366     History  Chief Complaint  Patient presents with   Abdominal Pain    Kelly Sweeney is a 32 y.o. female.  HPI   32 year old female presents emergency department with concern for ongoing vaginal spotting and hematuria.  Patient states for the past month she has been having pelvic area discomfort that radiates around to her back.  She believes that she has history of ovarian cyst and possible PCOS.  She was seen by OB/GYN yesterday and had a wet prep/GC chlamydia that was all negative.  At that time she was noted to have hematuria without UTI.  Patient states her last ultrasound was 4 to 5 years ago.  She presents today because the pelvic pain seems worse and her main concern is to rule out any form of cancer.  She states she has strong family history of cancer and she is very anxious about what this is persistent spotting/blood in the urine could mean.  No history of kidney stones.  No fever/chills.  Home Medications Prior to Admission medications   Medication Sig Start Date End Date Taking? Authorizing Provider  buPROPion (WELLBUTRIN SR) 100 MG 12 hr tablet Take 100 mg by mouth 2 (two) times daily. 06/03/20   [provider]  escitalopram (LEXAPRO) 20 MG tablet Take 20 mg by mouth daily. 06/03/20   [provider]  HUMALOG KWIKPEN 100 UNIT/ML KwikPen Inject into the skin 3 (three) times daily. 08/30/22   [provider]  LEVEMIR FLEXPEN 100 UNIT/ML FlexPen Inject into the skin. 08/30/22   [provider]  lisinopril (ZESTRIL) 40 MG tablet Take 40 mg by mouth daily.    [provider]  lisinopril (ZESTRIL) 40 MG tablet Take 1 tablet (40 mg total) by mouth daily. 01/30/23   Darrick Grinder, PA-C  metFORMIN (GLUCOPHAGE-XR) 500 MG 24 hr tablet Take 1,000 mg by mouth in the morning and at bedtime. 06/22/20   [provider]  pantoprazole (PROTONIX) 20 MG tablet Take 1 tablet by mouth daily. 10/24/22   [provider]  rosuvastatin (CRESTOR) 20 MG tablet Take 20 mg by mouth daily. 02/18/20   [provider]  Semaglutide, 1 MG/DOSE, (OZEMPIC, 1 MG/DOSE,) 2 MG/1.5ML SOPN Inject 2 mg into the skin once a week. 08/03/20   [provider]      Allergies    Patient has no known allergies.    Review of Systems   Review of Systems  Constitutional:  Negative for fever.  Gastrointestinal:  Negative for abdominal pain, diarrhea and vomiting.  Genitourinary:  Positive for dysuria, pelvic pain, urgency and vaginal discharge. Negative for flank pain and vaginal pain.  Musculoskeletal:  Negative for back pain.  Skin:  Negative for rash.  Neurological:  Negative for headaches.    Physical Exam Updated Vital Signs BP (!) 161/91 (BP Location: Right Arm)   Pulse 91   Temp 98.8 F (37.1 C) (Oral)   Resp 16   Ht 5\' 9"  (1.753 m)   Wt 134.7 kg   LMP 06/14/2023 (Exact Date)   SpO2 100%   BMI 43.86 kg/m  Physical Exam Vitals and nursing note reviewed.  Constitutional:      General: She is not in acute distress.    Appearance: Normal appearance.  HENT:     Head: Normocephalic.     Mouth/Throat:  Mouth: Mucous membranes are moist.  Cardiovascular:     Rate and Rhythm: Normal rate.  Pulmonary:     Effort: Pulmonary effort is normal. No respiratory distress.  Abdominal:     Palpations: Abdomen is soft.     Tenderness: There is no abdominal tenderness.  Skin:    General: Skin is warm.  Neurological:     Mental Status: She is alert and oriented to person, place, and time. Mental status is at baseline.  Psychiatric:        Mood and Affect: Mood normal.     ED Results / Procedures / Treatments   Labs (all labs ordered are listed, but only abnormal results are displayed) Labs Reviewed  URINALYSIS, ROUTINE W REFLEX MICROSCOPIC - Abnormal; Notable for the following  components:      Result Value   APPearance HAZY (*)    All other components within normal limits  PREGNANCY, URINE    EKG None  Radiology No results found.  Procedures Procedures    Medications Ordered in ED Medications - No data to display  ED Course/ Medical Decision Making/ A&P                                 Medical Decision Making Amount and/or Complexity of Data Reviewed Radiology: ordered.   32 year old female presents emergency department with ongoing vaginal spotting and hematuria.  Was evaluated by OB/GYN yesterday, had a full pelvic.  Wet prep and GC chlamydia was negative.  Vaginal discharge/spotting is unchanged, this full exam was within 24 hours, do not feel she would benefit from an additional full pelvic.  Vitals are normal and stable on arrival.  Patient endorses history of ovarian cyst/possible PCOS.  With worsening pelvic discomfort we will pursue ultrasound to rule out any acute pathology.  Ultrasound identifies a leiomyoma and also a thickened endometrial stripe.  Patient believes that she may be starting her menstrual period.  However I explained these results and the importance of following up with her OB/GYN for repeat imaging and further evaluation.  She understands.  In regards to the hematuria that she has been having with urinary frequency and dysuria, I will refer to urology.  Symptoms seem like they could be related to something like interstitial cystitis but she needs further evaluation.  Patient at this time appears safe and stable for discharge and close outpatient follow up. Discharge plan and strict return to ED precautions discussed, patient verbalizes understanding and agreement.        Final Clinical Impression(s) / ED Diagnoses Final diagnoses:  None    Rx / DC Orders ED Discharge Orders     None         Rozelle Logan, DO 06/29/23 1113

## 2023-06-29 NOTE — ED Triage Notes (Signed)
Abdominal and lower pain and bleeding from pelvic area X 1 month, seen at [facility] and everything was negative no treatment given.

## 2023-06-29 NOTE — Discharge Instructions (Signed)
You have been seen and discharged from the emergency department.  The ultrasound showed a fibroid and also thickened endometrial stripe.  This needs to be followed up by your OB/GYN doctor for further evaluation.  In regards to the urinary symptoms please call urology and establish care for further evaluation of possible interstitial cystitis.  Stay well-hydrated.  You may use over-the-counter medication for pain control.  Follow-up with your primary provider for further evaluation and further care. Take home medications as prescribed. If you have any worsening symptoms or further concerns for your health please return to an emergency department for further evaluation.

## 2023-11-14 ENCOUNTER — Emergency Department (HOSPITAL_COMMUNITY)
Admission: EM | Admit: 2023-11-14 | Discharge: 2023-11-14 | Disposition: A | Discharge status: Home / Self Care | Payer: PRIVATE HEALTH INSURANCE | Attending: Emergency Medicine | Admitting: Emergency Medicine

## 2023-11-14 ENCOUNTER — Other Ambulatory Visit: Payer: Self-pay

## 2023-11-14 ENCOUNTER — Encounter (HOSPITAL_COMMUNITY): Payer: Self-pay

## 2023-11-14 DIAGNOSIS — F410 Panic disorder [episodic paroxysmal anxiety] without agoraphobia: Secondary | ICD-10-CM | POA: Diagnosis not present

## 2023-11-14 DIAGNOSIS — F419 Anxiety disorder, unspecified: Secondary | ICD-10-CM | POA: Diagnosis present

## 2023-11-14 HISTORY — DX: Depression, unspecified: F32.A

## 2023-11-14 HISTORY — DX: Anxiety disorder, unspecified: F41.9

## 2023-11-14 LAB — COMPREHENSIVE METABOLIC PANEL
ALT: 21 U/L (ref 0–44)
AST: 19 U/L (ref 15–41)
Albumin: 3.8 g/dL (ref 3.5–5.0)
Alkaline Phosphatase: 61 U/L (ref 38–126)
Anion gap: 10 (ref 5–15)
BUN: 12 mg/dL (ref 6–20)
CO2: 20 mmol/L — ABNORMAL LOW (ref 22–32)
Calcium: 8.9 mg/dL (ref 8.9–10.3)
Chloride: 106 mmol/L (ref 98–111)
Creatinine, Ser: 0.71 mg/dL (ref 0.44–1.00)
GFR, Estimated: >60 mL/min (ref >60)
Glucose, Bld: 174 mg/dL — ABNORMAL HIGH (ref 70–99)
Potassium: 3.1 mmol/L — ABNORMAL LOW (ref 3.5–5.1)
Sodium: 136 mmol/L (ref 135–145)
Total Bilirubin: <0.2 mg/dL (ref <1.2)
Total Protein: 7.6 g/dL (ref 6.5–8.1)

## 2023-11-14 LAB — CBC WITH DIFFERENTIAL/PLATELET
Abs Immature Granulocytes: 0.04 10*3/uL (ref 0.00–0.07)
Basophils Absolute: 0 10*3/uL (ref 0.0–0.1)
Basophils Relative: 0 %
Eosinophils Absolute: 0.1 10*3/uL (ref 0.0–0.5)
Eosinophils Relative: 2 %
HCT: 36.3 % (ref 36.0–46.0)
Hemoglobin: 11.4 g/dL — ABNORMAL LOW (ref 12.0–15.0)
Immature Granulocytes: 1 %
Lymphocytes Relative: 50 %
Lymphs Abs: 2.7 10*3/uL (ref 0.7–4.0)
MCH: 24.3 pg — ABNORMAL LOW (ref 26.0–34.0)
MCHC: 31.4 g/dL (ref 30.0–36.0)
MCV: 77.2 fL — ABNORMAL LOW (ref 80.0–100.0)
Monocytes Absolute: 0.3 10*3/uL (ref 0.1–1.0)
Monocytes Relative: 5 %
Neutro Abs: 2.3 10*3/uL (ref 1.7–7.7)
Neutrophils Relative %: 42 %
Platelets: 276 10*3/uL (ref 150–400)
RBC: 4.7 MIL/uL (ref 3.87–5.11)
RDW: 15.2 % (ref 11.5–15.5)
WBC: 5.5 10*3/uL (ref 4.0–10.5)
nRBC: 0 % (ref 0.0–0.2)

## 2023-11-14 LAB — ETHANOL: Alcohol, Ethyl (B): <10 mg/dL (ref <10)

## 2023-11-14 LAB — HCG, SERUM, QUALITATIVE: Preg, Serum: NEGATIVE

## 2023-11-14 MED ORDER — ONDANSETRON 4 MG PO TBDP
4.0000 mg | ORAL_TABLET | Freq: Once | ORAL | Status: AC
  Administered 2023-11-14: 4 mg via ORAL
  Filled 2023-11-14: qty 1

## 2023-11-14 MED ORDER — HYDROXYZINE HCL 25 MG PO TABS
25.0000 mg | ORAL_TABLET | Freq: Once | ORAL | Status: AC
  Administered 2023-11-14: 25 mg via ORAL
  Filled 2023-11-14: qty 1

## 2023-11-14 MED ORDER — ALPRAZOLAM 0.5 MG PO TABS: 0.5000 mg | ORAL_TABLET | Freq: Two times a day (BID) | ORAL | 0 refills | Status: AC | PRN

## 2023-11-14 NOTE — ED Provider Notes (Signed)
[place] EMERGENCY DEPARTMENT AT [facility] Provider Note   CSN: [id number] Arrival date & time: 11/14/23  1824     History  Chief Complaint  Patient presents with   Anxiety    Kelly Sweeney is a 32 y.o. female.  Patient brought to the emergency department after having an episode of feeling severe dizziness.  Patient was driving and suddenly started feeling weak, had tingling all over her body.  Her mouth became dry and then she had a strange taste in her mouth.  She had to pull over while driving and could not get home.  She called EMS because she thought her blood sugar was dropping.       Home Medications Prior to Admission medications   Medication Sig Start Date End Date Taking? Authorizing Provider  ALPRAZolam Prudy Feeler) 0.5 MG tablet Take 1 tablet (0.5 mg total) by mouth 2 (two) times daily as needed for anxiety. 11/14/23  Yes Gilda Crease, MD  buPROPion [facility] SR) 100 MG 12 hr tablet Take 100 mg by mouth 2 (two) times daily. 06/03/20   [provider]  escitalopram (LEXAPRO) 20 MG tablet Take 20 mg by mouth daily. 06/03/20   [provider]  HUMALOG KWIKPEN 100 UNIT/ML KwikPen Inject into the skin 3 (three) times daily. 08/30/22   [provider]  LEVEMIR FLEXPEN 100 UNIT/ML FlexPen Inject into the skin. 08/30/22   [provider]  lisinopril (ZESTRIL) 40 MG tablet Take 40 mg by mouth daily.    [provider]  lisinopril (ZESTRIL) 40 MG tablet Take 1 tablet (40 mg total) by mouth daily. 01/30/23   Darrick Grinder, PA-C  metFORMIN (GLUCOPHAGE-XR) 500 MG 24 hr tablet Take 1,000 mg by mouth in the morning and at bedtime. 06/22/20   [provider]  pantoprazole (PROTONIX) 20 MG tablet Take 1 tablet by mouth daily. 10/24/22   [provider]  rosuvastatin (CRESTOR) 20 MG tablet Take 20 mg by mouth daily. 02/18/20   [provider]  Semaglutide, 1 MG/DOSE, (OZEMPIC, 1 MG/DOSE,) 2  MG/1.5ML SOPN Inject 2 mg into the skin once a week. 08/03/20   [provider]      Allergies    Patient has no known allergies.    Review of Systems   Review of Systems  Physical Exam Updated Vital Signs BP (!) 149/95   Pulse 73   Temp 98 F (36.7 C) (Oral)   Resp 18   Ht 5\' 9"  (1.753 m)   Wt 127 kg   SpO2 100%   BMI 41.35 kg/m  Physical Exam Vitals and nursing note reviewed.  Constitutional:      General: She is not in acute distress.    Appearance: She is well-developed.  HENT:     Head: Normocephalic and atraumatic.     Mouth/Throat:     Mouth: Mucous membranes are moist.  Eyes:     General: Vision grossly intact. Gaze aligned appropriately.     Extraocular Movements: Extraocular movements intact.     Conjunctiva/sclera: Conjunctivae normal.  Cardiovascular:     Rate and Rhythm: Normal rate and regular rhythm.     Pulses: Normal pulses.     Heart sounds: Normal heart sounds, S1 normal and S2 normal. No murmur heard.    No friction rub. No gallop.  Pulmonary:     Effort: Pulmonary effort is normal. No respiratory distress.     Breath sounds: Normal breath sounds.  Abdominal:  General: Bowel sounds are normal.     Palpations: Abdomen is soft.     Tenderness: There is no abdominal tenderness. There is no guarding or rebound.     Hernia: No hernia is present.  Musculoskeletal:        General: No swelling.     Cervical back: Full passive range of motion without pain, normal range of motion and neck supple. No spinous process tenderness or muscular tenderness. Normal range of motion.     Right lower leg: No edema.     Left lower leg: No edema.  Skin:    General: Skin is warm and dry.     Capillary Refill: Capillary refill takes less than 2 seconds.     Findings: No ecchymosis, erythema, rash or wound.  Neurological:     General: No focal deficit present.     Mental Status: She is alert and oriented to person, place, and time.     GCS: GCS eye  subscore is 4. GCS verbal subscore is 5. GCS motor subscore is 6.     Cranial Nerves: Cranial nerves 2-12 are intact.     Sensory: Sensation is intact.     Motor: Motor function is intact.     Coordination: Coordination is intact.  Psychiatric:        Attention and Perception: Attention normal.        Mood and Affect: Mood normal.        Speech: Speech normal.        Behavior: Behavior normal.     ED Results / Procedures / Treatments   Labs (all labs ordered are listed, but only abnormal results are displayed) Labs Reviewed  COMPREHENSIVE METABOLIC PANEL - Abnormal; Notable for the following components:      Result Value   Potassium 3.1 (*)    CO2 20 (*)    Glucose, Bld 174 (*)    All other components within normal limits  CBC WITH DIFFERENTIAL/PLATELET - Abnormal; Notable for the following components:   Hemoglobin 11.4 (*)    MCV 77.2 (*)    MCH 24.3 (*)    All other components within normal limits  ETHANOL  HCG, SERUM, QUALITATIVE  RAPID URINE DRUG SCREEN, HOSP PERFORMED    EKG EKG Interpretation Date/Time:  Wednesday November 14 2023 19:12:17 EST Ventricular Rate:  89 PR Interval:  164 QRS Duration:  90 QT Interval:  353 QTC Calculation: 430 R Axis:   26  Text Interpretation: Sinus rhythm Borderline T abnormalities, anterior leads Confirmed by Gilda Crease [phone]) on 11/14/2023 11:11:51 PM  Radiology No results found.  Procedures Procedures    Medications Ordered in ED Medications  hydrOXYzine (ATARAX) tablet 25 mg (25 mg Oral Given 11/14/23 2316)  ondansetron (ZOFRAN-ODT) disintegrating tablet 4 mg (4 mg Oral Given 11/14/23 1908)    ED Course/ Medical Decision Making/ A&P                                 Medical Decision Making Amount and/or Complexity of Data Reviewed Labs: ordered.   Differential diagnosis considered includes, but not limited to: Hypoglycemia; TIA; CVA; panic attack  Patient thought she was having a hypoglycemic  episode while driving.  Patient did not, in fact, have hypoglycemia.  Patient is now concerned that it might have been restarting Lexapro.  Patient does tell me that she has a lot going on, had a recent death that  she is dealing with.  Her psychiatrist put her back on her Lexapro.  She went right to the 20 mg tablets, not to 10 mg she was told to start with.  I do not think that this was clearly side effects or adverse response to the Lexapro.  This sounds more like a panic attack and is consistent with the stress that she has been having recently.  I recommend that she does go down to the 10 mg for at least a week and then go to 20 after that.  Will give her a limited supply of Xanax and she is to follow-up with her psychiatrist.        Final Clinical Impression(s) / ED Diagnoses Final diagnoses:  Panic attack    Rx / DC Orders ED Discharge Orders          Ordered    ALPRAZolam (XANAX) 0.5 MG tablet  2 times daily PRN        11/14/23 2318              Gilda Crease, MD 11/14/23 2318

## 2023-11-14 NOTE — ED Provider Triage Note (Signed)
Emergency Medicine Provider Triage Evaluation Note  Kelly Sweeney , a 32 y.o. female  was evaluated in triage.  Pt complains of tingling all over body, dry mouth and anxiety while driving. She feels light headed. Recently restarted lexapro.  Review of Systems  Positive: Weakness on one side of body Negative: paresthesias  Physical Exam  BP 137/89   Pulse 99   Temp 98.6 F (37 C) (Oral)   Resp 18   Ht 5\' 9"  (1.753 m)   Wt 127 kg   SpO2 100%   BMI 41.35 kg/m  Gen:   Awake, no distress   Resp:  Normal effort  MSK:   Moves extremities without difficulty  Other:  No neurodeficits  Medical Decision Making  Medically screening exam initiated at 6:54 PM.  Appropriate orders placed.  Kelly Sweeney was informed that the remainder of the evaluation will be completed by another provider, this initial triage assessment does not replace that evaluation, and the importance of remaining in the ED until their evaluation is complete.    Pete Pelt, [place] 11/14/23 [phone]

## 2023-11-14 NOTE — ED Triage Notes (Signed)
[place] [facility] [place]. [place] on the highway, felt shaky and lightheaded, thought it was her blood sugar dropping. Has been off of lexapro for 18 months, took 20mg  lexapro pill today at 2:30pm but was supposed to cut it to 10mg .   EMS CBG 146

## 2023-12-01 ENCOUNTER — Ambulatory Visit (HOSPITAL_COMMUNITY)
Admission: EM | Admit: 2023-12-01 | Discharge: 2023-12-01 | Disposition: A | Discharge status: Home / Self Care | Payer: PRIVATE HEALTH INSURANCE | Attending: Family | Admitting: Family

## 2023-12-01 DIAGNOSIS — F411 Generalized anxiety disorder: Secondary | ICD-10-CM | POA: Diagnosis not present

## 2023-12-01 MED ORDER — TRAZODONE HCL 50 MG PO TABS: 50.0000 mg | ORAL_TABLET | Freq: Every evening | ORAL | 0 refills | Status: AC | PRN

## 2023-12-01 MED ORDER — TRAZODONE HCL 50 MG PO TABS: 50.0000 mg | ORAL_TABLET | Freq: Every evening | ORAL | Status: DC | PRN

## 2023-12-01 NOTE — Discharge Instructions (Signed)
 Marland Kitchen

## 2023-12-01 NOTE — ED Provider Notes (Signed)
 Behavioral Health Urgent Care Medical Screening Exam  Patient Name: Kelly Sweeney MRN: [id number] Date of Evaluation: 12/01/23 Chief Complaint:  Worsening depression and anxiety symptoms Diagnosis:  Final diagnoses:  Generalized anxiety disorder    History of Present illness: Kelly Sweeney is a 33 y.o. female.  Presents significantly urgent care reporting increased anxiety symptoms.  Patient is accompanied by a coworker.  She provided verbal authorization for coworker to stay during this assessment.  Lanice states she had a panic attack on December 18/2024 where she was seen and evaluated at the local emergency department.  States she was discharged on Xanax  and hydroxyzine .  States she has been cutting her Xanax  in half in order to make them stretch.  She reports she is currently followed by psychiatry services.  States her psychiatrist is out of [place] , states her next follow-up appointment is in February 1 month out.  Reports she was recently restarted on Lexapro  and Wellbutrin  which has helped in the past.  Stated she was unable to tolerate the Lexapro  so she was changed to Zoloft however states she feels the medication is making her symptoms worse.  She denied illicit drug use or substance abuse history.  States she recently reached out to counseling services for therapy.  She reports that discharge she was given hydroxyzine  25 mg however she has been taking half a tablet of the hydroxyzine  which is not helping with her symptoms.  She reports frequent panic/anxiety attacks.  Discussed taking the whole tablet 25 mg may repeat as needed 3 times daily.  Will make trazodone  25 to 50 mg available for insomnia.  Discussed following up with intensive outpatient programming she was receptive to plan.  Case staffed with attending psychiatrist Goli.  Support, encouragement  and reassurance was provided.   Kelly Sweeney is sitting in no acute distress. She is alert/oriented x 4; calm/cooperative;  and mood congruent with affect. She is speaking in a clear tone at moderate volume, and normal pace; with good eye contact. Her thought process is coherent and relevant; There is no indication that she is currently responding to internal/external stimuli or experiencing delusional thought content; and she has denied suicidal/self-harm/homicidal ideation, psychosis, and paranoia.   Patient has remained calm throughout assessment and has answered questions appropriately.    At this time Kelly Sweeney is educated and verbalizes understanding of mental health resources and other crisis services in the community. She is instructed to call 911 and present to the nearest emergency room should she experience any suicidal/homicidal ideation, auditory/visual/hallucinations, or detrimental worsening of her mental health condition. She was a also advised by clinical research associate that she could call the toll-free phone on insurance card to assist with identifying in network counselors and agencies or number on back of Medicaid card t speak with care coordinator     Flowsheet Row ED from 12/01/2023 in [facility] ED from 11/14/2023 in [facility] Emergency Department at [facility] ED from 06/29/2023 in [facility] Emergency Department at [facility]  C-SSRS RISK CATEGORY No Risk No Risk No Risk       Psychiatric Specialty Exam  Presentation  General Appearance:Appropriate for Environment  Eye Contact:Good  Speech:Clear and Coherent  Speech Volume:Normal  Handedness:Right   Mood and Affect  Mood: Anxious; Depressed  Affect: Congruent   Thought Process  Thought Processes: Coherent  Descriptions of Associations:Intact  Orientation:Full (Time, Place and Person)  Thought Content:Logical  Diagnosis of Schizophrenia or Schizoaffective disorder in past: No data  recorded  Hallucinations:None  Ideas of Reference:None  Suicidal Thoughts:No  Homicidal  Thoughts:No   Sensorium  Memory: Immediate Good; Recent Good; Remote Good  Judgment: Good  Insight: Good   Executive Functions  Concentration: Fair  Attention Span: Fair  Recall: Good  Fund of Knowledge: Good  Language: Good   Psychomotor Activity  Psychomotor Activity: Increased; Normal   Assets  Assets: Desire for Improvement   Sleep  Sleep: Fair  Number of hours: No data recorded  Physical Exam: Physical Exam Vitals and nursing note reviewed.  Cardiovascular:     Rate and Rhythm: Normal rate and regular rhythm.  Pulmonary:     Effort: Pulmonary effort is normal.  Neurological:     Mental Status: She is alert and oriented to person, place, and time.  Psychiatric:        Mood and Affect: Mood normal.        Thought Content: Thought content normal.    Review of Systems  Psychiatric/Behavioral:  Positive for depression. Negative for suicidal ideas. The patient is nervous/anxious.   All other systems reviewed and are negative.  Blood pressure 135/84, pulse 88, temperature 98.6 F (37 C), temperature source Oral, resp. rate 18, SpO2 100%. There is no height or weight on file to calculate BMI.  Musculoskeletal: Strength & Muscle Tone: within normal limits Gait & Station: normal Patient leans: N/A   [facility] MSE Discharge Disposition for Follow up and Recommendations: Based on my evaluation the patient does not appear to have an emergency medical condition and can be discharged with resources and follow up care in outpatient services for Medication Management, Partial Hospitalization Program, and Individual Therapy  Provider trazodone  25-50 mg p.o. nightly x 10 tablets -Follow-up with intensive outpatient programming -Patient provided with work excuse until 12/03/2022   Staci LOISE Kerns, NP 12/01/2023, 12:42 PM

## 2023-12-01 NOTE — Progress Notes (Signed)
   12/01/23 1156  [facility] Triage Screening (Walk-ins at [facility] only)  What Is the Reason for Your Visit/Call Today? Kelly Sweeney presents to [facility] voluntarily accompanied by a friend/coworker [name]. Pt states that she has been dealing with depression and anxiety. Pt states that on Dec. 18, 2024, she had a panic attack where she called EMS and was taking to [facility]. Pt states that she received Zofran  and 25mg  of Hydroxyzine . Pt reports that her panic attacks have gotten to the point that she has tingling around the mouth and feeling faint. Pt denies SI, HI, AVH and alcohol/drug use.  How Long Has This Been Causing You Problems? 1 wk - 1 month  Have You Recently Had Any Thoughts About Hurting Yourself? No  Are You Planning to Commit Suicide/Harm Yourself At This time? No  Have you Recently Had Thoughts About Hurting Someone Sherral? No  Are You Planning To Harm Someone At This Time? No  Physical Abuse Denies  Verbal Abuse Denies  Sexual Abuse Denies  Exploitation of patient/patient's resources Denies  Self-Neglect Denies  Are you currently experiencing any auditory, visual or other hallucinations? No  Have You Used Any Alcohol or Drugs in the Past 24 Hours? No  Do you have any current medical co-morbidities that require immediate attention? No  Clinician description of patient physical appearance/behavior: tearful, cooperative  What Do You Feel Would Help You the Most Today? Social Support;Medication(s);Treatment for Depression or other mood problem  If access to [facility] was not available, would you have sought care in the Emergency Department? No  Determination of Need Routine (7 days)  Options For Referral Medication Management;Outpatient Therapy

## 2023-12-03 ENCOUNTER — Telehealth (HOSPITAL_COMMUNITY): Payer: Self-pay | Admitting: Psychiatry

## 2024-02-09 ENCOUNTER — Emergency Department (HOSPITAL_BASED_OUTPATIENT_CLINIC_OR_DEPARTMENT_OTHER)
Admission: EM | Admit: 2024-02-09 | Discharge: 2024-02-09 | Disposition: A | Discharge status: Home / Self Care | Payer: PRIVATE HEALTH INSURANCE | Attending: Emergency Medicine | Admitting: Emergency Medicine

## 2024-02-09 ENCOUNTER — Encounter (HOSPITAL_BASED_OUTPATIENT_CLINIC_OR_DEPARTMENT_OTHER): Payer: Self-pay | Admitting: Emergency Medicine

## 2024-02-09 ENCOUNTER — Other Ambulatory Visit: Payer: Self-pay

## 2024-02-09 DIAGNOSIS — R002 Palpitations: Secondary | ICD-10-CM | POA: Diagnosis present

## 2024-02-09 DIAGNOSIS — N189 Chronic kidney disease, unspecified: Secondary | ICD-10-CM | POA: Insufficient documentation

## 2024-02-09 DIAGNOSIS — Z7984 Long term (current) use of oral hypoglycemic drugs: Secondary | ICD-10-CM | POA: Diagnosis not present

## 2024-02-09 DIAGNOSIS — Z79899 Other long term (current) drug therapy: Secondary | ICD-10-CM | POA: Insufficient documentation

## 2024-02-09 DIAGNOSIS — N3001 Acute cystitis with hematuria: Secondary | ICD-10-CM | POA: Insufficient documentation

## 2024-02-09 DIAGNOSIS — F419 Anxiety disorder, unspecified: Secondary | ICD-10-CM | POA: Diagnosis not present

## 2024-02-09 DIAGNOSIS — E119 Type 2 diabetes mellitus without complications: Secondary | ICD-10-CM | POA: Insufficient documentation

## 2024-02-09 LAB — COMPREHENSIVE METABOLIC PANEL
ALT: 17 U/L (ref 0–44)
AST: 14 U/L — ABNORMAL LOW (ref 15–41)
Albumin: 4.3 g/dL (ref 3.5–5.0)
Alkaline Phosphatase: 65 U/L (ref 38–126)
Anion gap: 12 (ref 5–15)
BUN: 15 mg/dL (ref 6–20)
CO2: 20 mmol/L — ABNORMAL LOW (ref 22–32)
Calcium: 9.7 mg/dL (ref 8.9–10.3)
Chloride: 106 mmol/L (ref 98–111)
Creatinine, Ser: 0.98 mg/dL (ref 0.44–1.00)
GFR, Estimated: >60 mL/min (ref >60)
Glucose, Bld: 157 mg/dL — ABNORMAL HIGH (ref 70–99)
Potassium: 3.7 mmol/L (ref 3.5–5.1)
Sodium: 138 mmol/L (ref 135–145)
Total Bilirubin: 0.5 mg/dL (ref 0.0–1.2)
Total Protein: 8.2 g/dL — ABNORMAL HIGH (ref 6.5–8.1)

## 2024-02-09 LAB — CBC WITH DIFFERENTIAL/PLATELET
Abs Immature Granulocytes: 0.01 10*3/uL (ref 0.00–0.07)
Basophils Absolute: 0 10*3/uL (ref 0.0–0.1)
Basophils Relative: 0 %
Eosinophils Absolute: 0 10*3/uL (ref 0.0–0.5)
Eosinophils Relative: 0 %
HCT: 35.4 % — ABNORMAL LOW (ref 36.0–46.0)
Hemoglobin: 11.3 g/dL — ABNORMAL LOW (ref 12.0–15.0)
Immature Granulocytes: 0 %
Lymphocytes Relative: 37 %
Lymphs Abs: 2 10*3/uL (ref 0.7–4.0)
MCH: 23.8 pg — ABNORMAL LOW (ref 26.0–34.0)
MCHC: 31.9 g/dL (ref 30.0–36.0)
MCV: 74.7 fL — ABNORMAL LOW (ref 80.0–100.0)
Monocytes Absolute: 0.4 10*3/uL (ref 0.1–1.0)
Monocytes Relative: 7 %
Neutro Abs: 2.9 10*3/uL (ref 1.7–7.7)
Neutrophils Relative %: 56 %
Platelets: 360 10*3/uL (ref 150–400)
RBC: 4.74 MIL/uL (ref 3.87–5.11)
RDW: 14.7 % (ref 11.5–15.5)
WBC: 5.3 10*3/uL (ref 4.0–10.5)
nRBC: 0 % (ref 0.0–0.2)

## 2024-02-09 LAB — URINALYSIS, W/ REFLEX TO CULTURE (INFECTION SUSPECTED)
Bilirubin Urine: NEGATIVE
Glucose, UA: NEGATIVE mg/dL
Ketones, ur: NEGATIVE mg/dL
Leukocytes,Ua: NEGATIVE
Nitrite: NEGATIVE
Protein, ur: 100 mg/dL — AB
RBC / HPF: >50 RBC/hpf (ref 0–5)
Specific Gravity, Urine: 1.025 (ref 1.005–1.030)
pH: 6 (ref 5.0–8.0)

## 2024-02-09 LAB — HCG, SERUM, QUALITATIVE: Preg, Serum: NEGATIVE

## 2024-02-09 LAB — TROPONIN I (HIGH SENSITIVITY): Troponin I (High Sensitivity): <2 ng/L (ref <18)

## 2024-02-09 LAB — CBG MONITORING, ED: Glucose-Capillary: 145 mg/dL — ABNORMAL HIGH (ref 70–99)

## 2024-02-09 LAB — D-DIMER, QUANTITATIVE: D-Dimer, Quant: <0.27 ug{FEU}/mL (ref 0.00–0.50)

## 2024-02-09 MED ORDER — NITROFURANTOIN MONOHYD MACRO 100 MG PO CAPS: 100.0000 mg | ORAL_CAPSULE | Freq: Two times a day (BID) | ORAL | 0 refills | Status: AC

## 2024-02-09 MED ORDER — ACETAMINOPHEN 325 MG PO TABS
650.0000 mg | ORAL_TABLET | Freq: Once | ORAL | Status: AC
  Administered 2024-02-09: 650 mg via ORAL
  Filled 2024-02-09: qty 2

## 2024-02-09 MED ORDER — KETOROLAC TROMETHAMINE 15 MG/ML IJ SOLN
15.0000 mg | Freq: Once | INTRAMUSCULAR | Status: DC
  Filled 2024-02-09: qty 1

## 2024-02-09 MED ORDER — SODIUM CHLORIDE 0.9 % IV BOLUS
1000.0000 mL | Freq: Once | INTRAVENOUS | Status: AC
  Administered 2024-02-09: 1000 mL via INTRAVENOUS

## 2024-02-09 NOTE — ED Provider Notes (Addendum)
 [place] [facility] Provider Note   CSN: [id number] Arrival date & time: 02/09/24  4098     History  Chief Complaint  Patient presents with   Palpitations    Kelly Sweeney is a 33 y.o. female.  This is a 33 year old female who is here today for multiple complaints.  Patient endorses having palpitations which started last evening, she is also concerned because she had had some urinary symptoms earlier in the week, went to her PCP who noted that she did have some protein in her urine.  Patient has a history of chronic kidney disease in her family and was concerned that she could be developing that.  She also checked her blood sugar, noticed it was a bit high, and noticed that her blood pressure was also a bit high.  Patient follows with endocrinology for her diabetes, is on Mounjaro, metformin, and as needed insulin.  She has been compliant with all of her medications, regularly follows up with Endo.  She is on lisinopril for her hypertension and proteinuria.  Patient works in [employer], [employer].  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   Palpitations      Home Medications Prior to Admission medications   Medication Sig Start Date End Date Taking? Authorizing Provider  ALPRAZolam Prudy Feeler) 0.5 MG tablet Take 1 tablet (0.5 mg total) by mouth 2 (two) times daily as needed for anxiety. 11/14/23   Gilda Crease, MD  buPROPion (WELLBUTRIN SR) 100 MG 12 hr tablet Take 100 mg by mouth 2 (two) times daily. 06/03/20   [provider]  escitalopram (LEXAPRO) 20 MG tablet Take 20 mg by mouth daily. 06/03/20   [provider]  HUMALOG KWIKPEN 100 UNIT/ML KwikPen Inject into the skin 3 (three) times daily. 08/30/22   [provider]  LEVEMIR FLEXPEN 100 UNIT/ML FlexPen Inject into the skin. 08/30/22   [provider]  lisinopril (ZESTRIL) 40 MG tablet Take 40 mg by mouth daily.    [provider]  lisinopril (ZESTRIL) 40 MG tablet Take 1 tablet (40 mg total) by mouth daily. 01/30/23   Darrick Grinder, PA-C  metFORMIN (GLUCOPHAGE-XR) 500 MG 24 hr tablet Take 1,000 mg by mouth in the morning and at bedtime. 06/22/20   [provider]  pantoprazole (PROTONIX) 20 MG tablet Take 1 tablet by mouth daily. 10/24/22   [provider]  rosuvastatin (CRESTOR) 20 MG tablet Take 20 mg by mouth daily. 02/18/20   [provider]  Semaglutide, 1 MG/DOSE, (OZEMPIC, 1 MG/DOSE,) 2 MG/1.5ML SOPN Inject 2 mg into the skin once a week. 08/03/20   [provider]  traZODone (DESYREL) 50 MG tablet Take 1 tablet (50 mg total) by mouth at bedtime and may repeat dose one time if needed. 12/01/23   Oneta Rack, NP      Allergies    Patient has no known allergies.    Review of Systems   Review of Systems  Cardiovascular:  Positive for palpitations.    Physical Exam Updated Vital Signs BP 126/89   Pulse (!) 123   Temp 97.9 F (36.6 C)   Resp 14   Wt 125.2 kg   LMP 02/06/2024 (Exact Date)   SpO2 100%   BMI 40.76 kg/m  Physical Exam Vitals reviewed.  Constitutional:  Appearance: She is not ill-appearing or toxic-appearing.  Eyes:     Pupils: Pupils are equal, round, and reactive to light.  Cardiovascular:     Rate and Rhythm: Tachycardia present.  Pulmonary:     Effort: Pulmonary effort is normal.     Breath sounds: Normal breath sounds.  Abdominal:     General: Abdomen is flat.     Palpations: Abdomen is soft.  Musculoskeletal:     Cervical back: Normal range of motion.  Neurological:     General: No focal deficit present.     Mental Status: She is alert.  Psychiatric:     Comments: Anxious.  Denies SI or HI     ED Results / Procedures / Treatments   Labs (all labs ordered are listed, but only abnormal results are displayed) Labs Reviewed   COMPREHENSIVE METABOLIC PANEL - Abnormal; Notable for the following components:      Result Value   CO2 20 (*)    Glucose, Bld 157 (*)    Total Protein 8.2 (*)    AST 14 (*)    All other components within normal limits  CBC WITH DIFFERENTIAL/PLATELET - Abnormal; Notable for the following components:   Hemoglobin 11.3 (*)    HCT 35.4 (*)    MCV 74.7 (*)    MCH 23.8 (*)    All other components within normal limits  URINALYSIS, W/ REFLEX TO CULTURE (INFECTION SUSPECTED) - Abnormal; Notable for the following components:   APPearance CLOUDY (*)    Hgb urine dipstick LARGE (*)    Protein, ur 100 (*)    Bacteria, UA MANY (*)    All other components within normal limits  CBG MONITORING, ED - Abnormal; Notable for the following components:   Glucose-Capillary 145 (*)    All other components within normal limits  URINE CULTURE  D-DIMER, QUANTITATIVE  HCG, SERUM, QUALITATIVE  TROPONIN I (HIGH SENSITIVITY)    EKG EKG Interpretation Date/Time:  Saturday February 09 2024 09:36:40 EDT Ventricular Rate:  117 PR Interval:  156 QRS Duration:  79 QT Interval:  307 QTC Calculation: 429 R Axis:   40  Text Interpretation: Sinus tachycardia Confirmed by Anders Simmonds [phone]) on 02/09/2024 10:58:58 AM  Radiology No results found.  Procedures Procedures    Medications Ordered in ED Medications  ketorolac (TORADOL) 15 MG/ML injection 15 mg (15 mg Intravenous Patient Refused/Not Given 02/09/24 1044)  sodium chloride 0.9 % bolus 1,000 mL (1,000 mLs Intravenous New Bag/Given 02/09/24 1021)  acetaminophen (TYLENOL) tablet 650 mg (650 mg Oral Given 02/09/24 1045)    ED Course/ Medical Decision Making/ A&P                                 Medical Decision Making 33 year old female here today for multiple complaints.  Plan-my independent review of the patient's EKG shows no ST segment depressions or elevations, no T wave inversions, no evidence of acute ischemia.  Sinus tachycardia.  I do  think the anxiety could be contributing to a portion of the patient's symptoms.  Her CBC and BMP are normal.  D-dimer negative, ordered due to the patient's tachycardia.  She has clear lung sounds.  Patient's urine does have many bacteria and some blood.  With the urinary symptoms, I think it is reasonable to treat with Macrobid.  TSH not ordered as the patient has had 1 done recently by PCP.  Nursing triage note there  is mention of the patient endorsing vague suicidality, however on my assessment of the patient, this seems to be more stress and anxiety, patient does not endorse any intention of self-harm.  Patient forward thinking, reliable.  Will discharge with Macrobid.  Amount and/or Complexity of Data Reviewed Labs: ordered.  Risk OTC drugs. Prescription drug management.           Final Clinical Impression(s) / ED Diagnoses Final diagnoses:  Palpitations  Anxiety  Acute cystitis with hematuria    Rx / DC Orders ED Discharge Orders     None         Anders Simmonds T, DO 02/09/24 1101    Anders Simmonds T, DO 02/09/24 1104

## 2024-02-09 NOTE — Discharge Instructions (Signed)
 You can begin taking Macrobid 2 times per day for the next 5 days.  Your blood work overall was normal.  Your EKG showed a slightly fast heart rate, but nothing to be concerned about.  Make sure that you are drinking plenty of water over the next few days.  Follow-up with your PCP.

## 2024-02-09 NOTE — ED Triage Notes (Addendum)
 Recurrent palpitation started last night , denies chest pain or shortness of breath , Hx anxiety , HTN and diabetes . Appears anxious during triage .  Pt is tearful , feeling depressed and expressed suicidal ideations with no plan .

## 2024-02-10 ENCOUNTER — Emergency Department (HOSPITAL_BASED_OUTPATIENT_CLINIC_OR_DEPARTMENT_OTHER): Payer: PRIVATE HEALTH INSURANCE

## 2024-02-10 ENCOUNTER — Other Ambulatory Visit: Payer: Self-pay

## 2024-02-10 ENCOUNTER — Emergency Department (HOSPITAL_BASED_OUTPATIENT_CLINIC_OR_DEPARTMENT_OTHER)
Admission: EM | Admit: 2024-02-10 | Discharge: 2024-02-10 | Disposition: A | Discharge status: Home / Self Care | Payer: PRIVATE HEALTH INSURANCE | Attending: Emergency Medicine | Admitting: Emergency Medicine

## 2024-02-10 DIAGNOSIS — F419 Anxiety disorder, unspecified: Secondary | ICD-10-CM | POA: Insufficient documentation

## 2024-02-10 DIAGNOSIS — Z79899 Other long term (current) drug therapy: Secondary | ICD-10-CM | POA: Insufficient documentation

## 2024-02-10 DIAGNOSIS — R0602 Shortness of breath: Secondary | ICD-10-CM | POA: Insufficient documentation

## 2024-02-10 DIAGNOSIS — R002 Palpitations: Secondary | ICD-10-CM | POA: Diagnosis present

## 2024-02-10 DIAGNOSIS — E119 Type 2 diabetes mellitus without complications: Secondary | ICD-10-CM | POA: Diagnosis not present

## 2024-02-10 DIAGNOSIS — Z7984 Long term (current) use of oral hypoglycemic drugs: Secondary | ICD-10-CM | POA: Diagnosis not present

## 2024-02-10 DIAGNOSIS — I10 Essential (primary) hypertension: Secondary | ICD-10-CM | POA: Insufficient documentation

## 2024-02-10 LAB — URINE CULTURE: Culture: 30000 — AB

## 2024-02-10 NOTE — Discharge Instructions (Signed)
 Please read and follow all provided instructions.  Your diagnoses today include:  1. Palpitations   2. Anxiety     Tests performed today include: An EKG of your heart: No abnormal heart rhythms A chest x-ray: Was clear no signs of problems with the lungs Vital signs. See below for your results today.   Medications prescribed:  None  Take any prescribed medications only as directed.  Follow-up instructions: Please follow-up with your primary care provider as soon as you can for further evaluation of your symptoms.   Return instructions:  SEEK IMMEDIATE MEDICAL ATTENTION IF: You have severe chest pain, especially if the pain is crushing or pressure-like and spreads to the arms, back, neck, or jaw, or if you have sweating, nausea or vomiting, or trouble with breathing. THIS IS AN EMERGENCY. Do not wait to see if the pain will go away. Get medical help at once. Call 911. DO NOT drive yourself to the hospital.  You have any other emergent concerns regarding your health.  Your vital signs today were: BP (!) 141/92 (BP Location: Right Wrist)   Pulse (!) 108   Temp 98.4 F (36.9 C)   Resp 20   LMP 02/06/2024 (Exact Date)   SpO2 100%  If your blood pressure (BP) was elevated above 135/85 this visit, please have this repeated by your doctor within one month. --------------

## 2024-02-10 NOTE — ED Triage Notes (Addendum)
 Pt reports that she was seen here yesterday and felt fine after she was discharged. States that she is having palpitations on and off. States that she is still having SOB. States that she thinks it could be anxiety.

## 2024-02-10 NOTE — ED Provider Notes (Signed)
 [place] [facility] Provider Note   CSN: [id number] Arrival date & time: 02/10/24  1142     History  Chief Complaint  Patient presents with   Shortness of Breath    Kelly Sweeney is a 33 y.o. female.  Patient with history of diabetes, hypertension, high cholesterol, anxiety on Zoloft --presents to the emergency department today for ongoing palpitations.  Patient was seen in the emergency department yesterday for the same.  She states that she felt improved after leaving but awoke this morning with shaking and increased palpitations.  She was not sure what to do so she came back to the emergency department.  No significant chest pain.  She does have shortness of breath when she feels worse.  Patient does report a history of significant anxiety causing her to recently take a leave from work.  She has recently returned and endorses increased stressors from that.  She also reports starting on Librium for sleep in January and has been tapering off of that.  She recently had her antidepressant dose increased as well.  She has Xanax prescribed to her which she does not typically take.  She has not taken in the past few days.  She does not like the way that this makes her feel in general.       Home Medications Prior to Admission medications   Medication Sig Start Date End Date Taking? Authorizing Provider  ALPRAZolam Prudy Feeler) 0.5 MG tablet Take 1 tablet (0.5 mg total) by mouth 2 (two) times daily as needed for anxiety. 11/14/23   Gilda Crease, MD  buPROPion (WELLBUTRIN SR) 100 MG 12 hr tablet Take 100 mg by mouth 2 (two) times daily. 06/03/20   [provider]  escitalopram (LEXAPRO) 20 MG tablet Take 20 mg by mouth daily. 06/03/20   [provider]  HUMALOG KWIKPEN 100 UNIT/ML KwikPen Inject into the skin 3 (three) times daily. 08/30/22   [provider]  LEVEMIR FLEXPEN 100 UNIT/ML FlexPen Inject into the skin. 08/30/22    [provider]  lisinopril (ZESTRIL) 40 MG tablet Take 40 mg by mouth daily.    [provider]  lisinopril (ZESTRIL) 40 MG tablet Take 1 tablet (40 mg total) by mouth daily. 01/30/23   Darrick Grinder, PA-C  metFORMIN (GLUCOPHAGE-XR) 500 MG 24 hr tablet Take 1,000 mg by mouth in the morning and at bedtime. 06/22/20   [provider]  nitrofurantoin, macrocrystal-monohydrate, (MACROBID) 100 MG capsule Take 1 capsule (100 mg total) by mouth 2 (two) times daily. 02/09/24   Anders Simmonds T, DO  pantoprazole (PROTONIX) 20 MG tablet Take 1 tablet by mouth daily. 10/24/22   [provider]  rosuvastatin (CRESTOR) 20 MG tablet Take 20 mg by mouth daily. 02/18/20   [provider]  Semaglutide, 1 MG/DOSE, (OZEMPIC, 1 MG/DOSE,) 2 MG/1.5ML SOPN Inject 2 mg into the skin once a week. 08/03/20   [provider]  traZODone (DESYREL) 50 MG tablet Take 1 tablet (50 mg total) by mouth at bedtime and may repeat dose one time if needed. 12/01/23   Oneta Rack, NP      Allergies    Patient has no known allergies.    Review of Systems   Review of Systems  Physical Exam Updated Vital Signs BP (!) 141/92 (BP Location: Right Wrist)   Pulse (!) 108   Temp 98.4 F (36.9 C)   Resp 20   LMP 02/06/2024 (Exact Date)  SpO2 100%   Physical Exam Vitals and nursing note reviewed.  Constitutional:      Appearance: She is well-developed. She is not diaphoretic.  HENT:     Head: Normocephalic and atraumatic.     Mouth/Throat:     Mouth: Mucous membranes are not dry.  Eyes:     Conjunctiva/sclera: Conjunctivae normal.  Neck:     Vascular: Normal carotid pulses. No JVD.     Trachea: Trachea normal. No tracheal deviation.  Cardiovascular:     Rate and Rhythm: Normal rate and regular rhythm.     Pulses: No decreased pulses.          Radial pulses are 2+ on the right side and 2+ on the left side.     Heart sounds: Normal heart sounds, S1 normal and S2 normal.  No murmur heard. Pulmonary:     Effort: Pulmonary effort is normal. No respiratory distress.     Breath sounds: No wheezing.  Chest:     Chest wall: No tenderness.  Abdominal:     General: Bowel sounds are normal.     Palpations: Abdomen is soft.     Tenderness: There is no abdominal tenderness. There is no guarding or rebound.  Musculoskeletal:        General: Normal range of motion.     Cervical back: Normal range of motion and neck supple. No muscular tenderness.  Skin:    General: Skin is warm and dry.     Coloration: Skin is not pale.  Neurological:     Mental Status: She is alert.  Psychiatric:        Mood and Affect: Mood is anxious.     ED Results / Procedures / Treatments   Labs (all labs ordered are listed, but only abnormal results are displayed) Labs Reviewed - No data to display  ED ECG REPORT   Date: 02/10/2024  Rate: 78  Rhythm: normal sinus rhythm  QRS Axis: normal  Intervals: normal  ST/T Wave abnormalities: normal  Conduction Disutrbances:none  Narrative Interpretation:   Old EKG Reviewed: changes noted, slower  I have personally reviewed the EKG tracing and agree with the computerized printout as noted.   Radiology No results found.  Procedures Procedures    Medications Ordered in ED Medications - No data to display  ED Course/ Medical Decision Making/ A&P    Patient seen and examined. History obtained directly from patient.  Reviewed ED visit from yesterday and workup performed.  Reviewed previous records such as TSH.  Labs/EKG: Ordered EKG  Imaging: Ordered chest x-ray.  Medications/Fluids: None ordered.  Considered benzo, but patient did drive.  Most recent vital signs reviewed and are as follows: BP (!) 141/92 (BP Location: Right Wrist)   Pulse (!) 108   Temp 98.4 F (36.9 C)   Resp 20   LMP 02/06/2024 (Exact Date)   SpO2 100%   Initial impression: Patient with anxiety and associated symptoms including feeling tremulous,  lightheaded, short of breath.  Agree with workup yesterday which was appropriate.  Do not feel that she requires additional blood work.  Will screen with EKG and just check a chest x-ray.  I have encouraged the patient to use alprazolam as needed for more severe symptoms.  12:42 PM Reassessment performed. Patient appears stable.  Heart rate in the 80s.  Oxygen level 100%.  Imaging personally visualized and interpreted including: Chest x-ray, agree negative.  Reviewed pertinent lab work and imaging with patient at bedside. Questions answered.  Most current vital signs reviewed and are as follows: BP (!) 141/92 (BP Location: Right Wrist)   Pulse (!) 108   Temp 98.4 F (36.9 C)   Resp 20   LMP 02/06/2024 (Exact Date)   SpO2 100%   Plan: Home with rest, try to relax, use benzodiazepines as needed.  Contact therapist for further recommendations.                                 Medical Decision Making Amount and/or Complexity of Data Reviewed Radiology: ordered.   Pt with anxiety causing SOB, palpitations.  She had extensive workup yesterday without signs of ACS, myocarditis, PE.  Chest x-ray today did not show pneumonia, pneumothorax, edema or other abnormalities.  EKG remained stable.  Suspect the patient symptoms are likely multifactorial, poorly controlled anxiety.  She does have benzodiazepine at home to use as needed.  No indication for further workup at this time.  Patient encouraged to continue home treatment plan and outpatient follow-up.  .The patient's vital signs, pertinent lab work and imaging were reviewed and interpreted as discussed in the ED course. Hospitalization was considered for further testing, treatments, or serial exams/observation. However as patient is well-appearing, has a stable exam, and reassuring studies today, I do not feel that they warrant admission at this time. This plan was discussed with the patient who verbalizes agreement and comfort with this plan  and seems reliable and able to return to the Emergency Department with worsening or changing symptoms.          Final Clinical Impression(s) / ED Diagnoses Final diagnoses:  Palpitations  Anxiety    Rx / DC Orders ED Discharge Orders     None         Renne Crigler, PA-C 02/10/24 1248    Anders Simmonds T, DO 02/12/24 [phone]

## 2024-02-22 ENCOUNTER — Emergency Department (HOSPITAL_BASED_OUTPATIENT_CLINIC_OR_DEPARTMENT_OTHER)
Admission: EM | Admit: 2024-02-22 | Discharge: 2024-02-22 | Disposition: A | Discharge status: Home / Self Care | Payer: PRIVATE HEALTH INSURANCE | Attending: Emergency Medicine | Admitting: Emergency Medicine

## 2024-02-22 ENCOUNTER — Emergency Department (HOSPITAL_BASED_OUTPATIENT_CLINIC_OR_DEPARTMENT_OTHER): Payer: PRIVATE HEALTH INSURANCE

## 2024-02-22 ENCOUNTER — Encounter (HOSPITAL_BASED_OUTPATIENT_CLINIC_OR_DEPARTMENT_OTHER): Payer: Self-pay | Admitting: *Deleted

## 2024-02-22 ENCOUNTER — Other Ambulatory Visit: Payer: Self-pay

## 2024-02-22 ENCOUNTER — Emergency Department (HOSPITAL_BASED_OUTPATIENT_CLINIC_OR_DEPARTMENT_OTHER)
Admission: EM | Admit: 2024-02-22 | Discharge: 2024-02-22 | Disposition: A | Discharge status: Home / Self Care | Payer: PRIVATE HEALTH INSURANCE | Source: Home / Self Care | Attending: Emergency Medicine | Admitting: Emergency Medicine

## 2024-02-22 DIAGNOSIS — R519 Headache, unspecified: Secondary | ICD-10-CM | POA: Insufficient documentation

## 2024-02-22 DIAGNOSIS — Z794 Long term (current) use of insulin: Secondary | ICD-10-CM | POA: Diagnosis not present

## 2024-02-22 DIAGNOSIS — Z3202 Encounter for pregnancy test, result negative: Secondary | ICD-10-CM | POA: Insufficient documentation

## 2024-02-22 DIAGNOSIS — R42 Dizziness and giddiness: Secondary | ICD-10-CM | POA: Insufficient documentation

## 2024-02-22 DIAGNOSIS — K219 Gastro-esophageal reflux disease without esophagitis: Secondary | ICD-10-CM | POA: Diagnosis not present

## 2024-02-22 DIAGNOSIS — R079 Chest pain, unspecified: Secondary | ICD-10-CM | POA: Insufficient documentation

## 2024-02-22 LAB — BASIC METABOLIC PANEL WITH GFR
Anion gap: 14 (ref 5–15)
BUN: 8 mg/dL (ref 6–20)
CO2: 22 mmol/L (ref 22–32)
Calcium: 9.7 mg/dL (ref 8.9–10.3)
Chloride: 101 mmol/L (ref 98–111)
Creatinine, Ser: 0.78 mg/dL (ref 0.44–1.00)
GFR, Estimated: >60 mL/min (ref >60)
Glucose, Bld: 154 mg/dL — ABNORMAL HIGH (ref 70–99)
Potassium: 4.3 mmol/L (ref 3.5–5.1)
Sodium: 137 mmol/L (ref 135–145)

## 2024-02-22 LAB — CBC
HCT: 35.9 % — ABNORMAL LOW (ref 36.0–46.0)
Hemoglobin: 11.4 g/dL — ABNORMAL LOW (ref 12.0–15.0)
MCH: 23.7 pg — ABNORMAL LOW (ref 26.0–34.0)
MCHC: 31.8 g/dL (ref 30.0–36.0)
MCV: 74.5 fL — ABNORMAL LOW (ref 80.0–100.0)
Platelets: 333 10*3/uL (ref 150–400)
RBC: 4.82 MIL/uL (ref 3.87–5.11)
RDW: 14.6 % (ref 11.5–15.5)
WBC: 4 10*3/uL (ref 4.0–10.5)
nRBC: 0 % (ref 0.0–0.2)

## 2024-02-22 LAB — TROPONIN I (HIGH SENSITIVITY)
Troponin I (High Sensitivity): <2 ng/L (ref <18)
Troponin I (High Sensitivity): 5 ng/L (ref <18)

## 2024-02-22 LAB — PREGNANCY, URINE: Preg Test, Ur: NEGATIVE

## 2024-02-22 LAB — CBG MONITORING, ED: Glucose-Capillary: 117 mg/dL — ABNORMAL HIGH (ref 70–99)

## 2024-02-22 MED ORDER — MECLIZINE HCL 25 MG PO TABS: 25.0000 mg | ORAL_TABLET | Freq: Three times a day (TID) | ORAL | 0 refills | Status: AC | PRN

## 2024-02-22 MED ORDER — ALUM & MAG HYDROXIDE-SIMETH 200-200-20 MG/5ML PO SUSP
15.0000 mL | Freq: Once | ORAL | Status: AC
  Administered 2024-02-22: 15 mL via ORAL
  Filled 2024-02-22: qty 30

## 2024-02-22 MED ORDER — FAMOTIDINE IN NACL 20-0.9 MG/50ML-% IV SOLN
20.0000 mg | Freq: Once | INTRAVENOUS | Status: AC
  Administered 2024-02-22: 20 mg via INTRAVENOUS
  Filled 2024-02-22: qty 50

## 2024-02-22 MED ORDER — ONDANSETRON 4 MG PO TBDP
4.0000 mg | ORAL_TABLET | Freq: Once | ORAL | Status: AC
  Administered 2024-02-22: 4 mg via ORAL
  Filled 2024-02-22: qty 1

## 2024-02-22 MED ORDER — FAMOTIDINE 20 MG PO TABS: 20.0000 mg | ORAL_TABLET | Freq: Two times a day (BID) | ORAL | 0 refills | Status: AC

## 2024-02-22 MED ORDER — ONDANSETRON HCL 4 MG PO TABS: 4.0000 mg | ORAL_TABLET | Freq: Four times a day (QID) | ORAL | 0 refills | Status: AC

## 2024-02-22 MED ORDER — PANTOPRAZOLE SODIUM 40 MG PO TBEC
40.0000 mg | DELAYED_RELEASE_TABLET | Freq: Every day | ORAL | Status: DC
  Administered 2024-02-22: 40 mg via ORAL
  Filled 2024-02-22: qty 1

## 2024-02-22 MED ORDER — MECLIZINE HCL 25 MG PO TABS
25.0000 mg | ORAL_TABLET | Freq: Once | ORAL | Status: AC
  Administered 2024-02-22: 25 mg via ORAL
  Filled 2024-02-22: qty 1

## 2024-02-22 MED ORDER — PANTOPRAZOLE SODIUM 40 MG PO TBEC: 40.0000 mg | DELAYED_RELEASE_TABLET | Freq: Every day | ORAL | 0 refills | Status: AC

## 2024-02-22 NOTE — ED Provider Notes (Signed)
 [place] [facility] Provider Note   CSN: [id number] Arrival date & time: 02/22/24  1338     History  Chief Complaint  Patient presents with   Chest Pain   Gastroesophageal Reflux    Kelly Sweeney is a 33 y.o. female.  Patient seen earlier today.  Had workup to address the reflux and chest pain complaint.  Patient had negative chest x-ray glucose was 154 white count was 4 hemoglobin 12.4.  Troponin was less than 2.  Pregnancy test was negative.  Patient states that she had a headache for 2 weeks.  No history of migraines but has had headaches before but is been associated with some dizziness and vertigo.  She can persistent nausea the headaches been for 2 weeks but the nausea and the room spinning's been for about 4 days.  Patient is never had really the dizziness or room spinning before.  Patient was discharged home with Zofran Pepcid.  Patient had been on Protonix in the past but her prescription has not due to arrive till Tuesday and she fat-pad a pocket to get Protonix picked up.  It was prescribed for her by the daytime doctor.  But she did not pick it up.  She is taking her Pepcid in the Zofran she says is not really helping the nausea.       Home Medications Prior to Admission medications   Medication Sig Start Date End Date Taking? Authorizing Provider  ALPRAZolam Prudy Feeler) 0.5 MG tablet Take 1 tablet (0.5 mg total) by mouth 2 (two) times daily as needed for anxiety. Patient taking differently: Take 0.5 mg by mouth as needed for anxiety. 11/14/23   Gilda Crease, MD  cetirizine (ZYRTEC) 10 MG tablet Take 10 mg by mouth daily. 01/12/24   [provider]  Cholecalciferol (D 1000) 25 MCG (1000 UT) capsule Take 1,000 Units by mouth daily. 07/04/23   [provider]  famotidine (PEPCID) 20 MG tablet Take 1 tablet (20 mg total) by mouth 2 (two) times daily. 02/22/24   Edwin Dada P, DO  fluticasone (FLONASE) 50 MCG/ACT  nasal spray Place 2 sprays into both nostrils as needed for allergies. 01/01/24   [provider]  HUMALOG KWIKPEN 100 UNIT/ML KwikPen Inject 1-3 Units into the skin as needed (low blood sugar). 08/30/22   [provider]  hydrOXYzine (ATARAX) 25 MG tablet Take 25 mg by mouth as needed for anxiety. 02/12/24   [provider]  lisinopril (ZESTRIL) 40 MG tablet Take 1 tablet (40 mg total) by mouth daily. 01/30/23   Darrick Grinder, PA-C  metFORMIN (GLUCOPHAGE-XR) 500 MG 24 hr tablet Take 1,000 mg by mouth in the morning and at bedtime. 06/22/20   [provider]  MOUNJARO 7.5 MG/0.5ML Pen Inject 7.5 mg into the skin once a week. Mondays 02/21/24   [provider]  nitrofurantoin, macrocrystal-monohydrate, (MACROBID) 100 MG capsule Take 1 capsule (100 mg total) by mouth 2 (two) times daily. Patient not taking: Reported on 02/22/2024 02/09/24   Anders Simmonds T, DO  ondansetron (ZOFRAN) 4 MG tablet Take 1 tablet (4 mg total) by mouth every 6 (six) hours. 02/22/24   Edwin Dada P, DO  pantoprazole (PROTONIX) 40 MG tablet Take 1 tablet (40 mg total) by mouth daily for 5 days. 02/22/24 02/27/24  Edwin Dada P, DO  pantoprazole (PROTONIX) 40 MG tablet Take 40 mg by mouth daily. 04/11/23 05/21/24  [provider]  rosuvastatin (CRESTOR) 20 MG tablet  Take 20 mg by mouth at bedtime. 02/18/20   [provider]  sertraline (ZOLOFT) 50 MG tablet Take 50 mg by mouth daily. 12/05/23   [provider]  traZODone (DESYREL) 50 MG tablet Take 1 tablet (50 mg total) by mouth at bedtime and may repeat dose one time if needed. Patient not taking: Reported on 02/22/2024 12/01/23   Oneta Rack, NP      Allergies    Patient has no known allergies.    Review of Systems   Review of Systems  Constitutional:  Negative for chills and fever.  HENT:  Negative for ear pain and sore throat.   Eyes:  Negative for pain and visual disturbance.  Respiratory:  Negative for  cough and shortness of breath.   Cardiovascular:  Positive for chest pain. Negative for palpitations.  Gastrointestinal:  Positive for nausea. Negative for abdominal pain and vomiting.  Genitourinary:  Negative for dysuria and hematuria.  Musculoskeletal:  Negative for arthralgias and back pain.  Skin:  Negative for color change and rash.  Neurological:  Positive for dizziness and headaches. Negative for seizures and syncope.  All other systems reviewed and are negative.   Physical Exam Updated Vital Signs BP (!) 130/93   Pulse 96   Temp 98 F (36.7 C) (Oral)   Resp 18   LMP 02/06/2024 (Exact Date)   SpO2 100%  Physical Exam Vitals and nursing note reviewed.  Constitutional:      General: She is not in acute distress.    Appearance: Normal appearance. She is well-developed.  HENT:     Head: Normocephalic and atraumatic.     Mouth/Throat:     Mouth: Mucous membranes are moist.  Eyes:     Extraocular Movements: Extraocular movements intact.     Conjunctiva/sclera: Conjunctivae normal.     Pupils: Pupils are equal, round, and reactive to light.  Cardiovascular:     Rate and Rhythm: Normal rate and regular rhythm.     Heart sounds: No murmur heard. Pulmonary:     Effort: Pulmonary effort is normal. No respiratory distress.     Breath sounds: Normal breath sounds.  Abdominal:     Palpations: Abdomen is soft.     Tenderness: There is no abdominal tenderness.  Musculoskeletal:        General: No swelling.     Cervical back: Normal range of motion and neck supple.  Skin:    General: Skin is warm and dry.     Capillary Refill: Capillary refill takes less than 2 seconds.  Neurological:     General: No focal deficit present.     Mental Status: She is alert and oriented to person, place, and time.     Cranial Nerves: No cranial nerve deficit.     Sensory: No sensory deficit.     Motor: No weakness.  Psychiatric:        Mood and Affect: Mood normal.     ED Results /  Procedures / Treatments   Labs (all labs ordered are listed, but only abnormal results are displayed) Labs Reviewed  CBG MONITORING, ED - Abnormal; Notable for the following components:      Result Value   Glucose-Capillary 117 (*)    All other components within normal limits  TROPONIN I (HIGH SENSITIVITY)  TROPONIN I (HIGH SENSITIVITY)    EKG EKG Interpretation Date/Time:  Friday February 22 2024 13:58:33 EDT Ventricular Rate:  109 PR Interval:  148 QRS Duration:  76 QT  Interval:  318 QTC Calculation: 428 R Axis:   55  Text Interpretation: Sinus tachycardia Otherwise normal ECG When compared with ECG of 22-Feb-2024 07:56, PREVIOUS ECG IS PRESENT NO SIGNIFICANT CHANGE SINCE LAST TRACING YESTERDAY Confirmed by Vanetta Mulders [phone]) on 02/22/2024 8:41:06 PM  Radiology CT Head Wo Contrast Result Date: 02/22/2024 CLINICAL DATA:  Headache with neurological deficit. EXAM: CT HEAD WITHOUT CONTRAST TECHNIQUE: Contiguous axial images were obtained from the base of the skull through the vertex without intravenous contrast. RADIATION DOSE REDUCTION: This exam was performed according to the departmental dose-optimization program which includes automated exposure control, adjustment of the mA and/or kV according to patient size and/or use of iterative reconstruction technique. COMPARISON:  None Available. FINDINGS: Brain: No evidence of acute infarction, hemorrhage, hydrocephalus, extra-axial collection or mass lesion/mass effect. Vascular: No hyperdense vessel or unexpected calcification. Skull: Normal. Negative for fracture or focal lesion. Sinuses/Orbits: No acute finding. Other: None. IMPRESSION: No acute intracranial abnormality. Electronically Signed   By: Darliss Cheney M.D.   On: 02/22/2024 20:51   DG Chest 2 View Result Date: 02/22/2024 CLINICAL DATA:  CP EXAM: CHEST - 2 VIEW COMPARISON:  02/10/2024 FINDINGS: Lungs clear. No pneumothorax. Electronic device overlies the left chest. Heart size  and mediastinal contours are within normal limits. No effusion. Visualized bones unremarkable. IMPRESSION: No acute cardiopulmonary disease. Electronically Signed   By: Corlis Leak M.D.   On: 02/22/2024 08:41    Procedures Procedures    Medications Ordered in ED Medications  meclizine (ANTIVERT) tablet 25 mg (25 mg Oral Given 02/22/24 2024)  ondansetron (ZOFRAN-ODT) disintegrating tablet 4 mg (4 mg Oral Given 02/22/24 2025)    ED Course/ Medical Decision Making/ A&P                                 Medical Decision Making Amount and/or Complexity of Data Reviewed Radiology: ordered.  Risk Prescription drug management.   Patient has troponin pending.  Troponin earlier today was less than 2.  The blood sugar here still good at 117.  Will give some Antivert will get head CT.  EKG unchanged from earlier today.  Is associated with a little bit of tachycardia.  Patient treated with some Antivert here.  Head CT without any acute findings.  Patient's repeat troponin here was 5 does not need a delta troponin because had troponin 2 earlier today all very reassuring.  He had originally ordered a D-dimer.  Will reach check with patient.  That apparently got canceled by the lab.  We may not need that I will reassess.  Clinically do not feel the patient needs D-dimer.  Patient's starting to feel anxious she has some baseline anxiety and has medicine for that.  She wants to go home.  She does feel better after the Antivert.  Will provide description with for Antivert.  Have her follow-up with her primary care doctor.   Final Clinical Impression(s) / ED Diagnoses Final diagnoses:  Dizziness    Rx / DC Orders ED Discharge Orders     None         Vanetta Mulders, MD 02/22/24 2303

## 2024-02-22 NOTE — ED Triage Notes (Signed)
 Pt was seen here this am in the ED and discharged and she is back as she continues to feel dizzy and lightheaded.  Pt also reports that while the medication she was given for nausea helped her after taking it it has returned.

## 2024-02-22 NOTE — Discharge Instructions (Signed)
 Please follow-up with your established GI specialist if you continue to have symptoms after getting your medications reordered.

## 2024-02-22 NOTE — ED Triage Notes (Signed)
 Generalized chest pain for 2 days with radiation to back which has been increasing and feels like someone is sitting on chest.  No increase with activity.  Pt has has nausea x3 days and states that she has GERD and has been out of her meds for 4 days.  No sob.  Pt has been having some dizziness with getting up fast

## 2024-02-22 NOTE — Discharge Instructions (Addendum)
 Make an appointment follow-up with your primary care doctor.  Today's workup head CT was negative.  Take the Antivert as directed.  May help with the symptoms.  Also your second troponin for today was also normal.  Take the medications that the daytime doctor had prescribed for you.

## 2024-02-22 NOTE — ED Provider Notes (Signed)
 [place] [facility] Provider Note   CSN: [id number] Arrival date & time: 02/22/24  0746     History  Chief Complaint  Patient presents with   Chest Pain    Kelly Sweeney is a 33 y.o. female.  Patient is a 33 year old female with past medical history of anxiety and GERD presenting for complaints of chest pain.  Patient admits to burning chest sensation, nausea, and indigestion over the past 2 days.  She states she has been out of her pantoprazole for 4 days and follows with GI for significant acid reflux symptoms.  She denies any difficulty breathing.  Reports some dizziness.  Denies any fevers or coughing.  Denies any lower extremity edema or orthopnea.  The history is provided by the patient. No language interpreter was used.  Chest Pain Associated symptoms: no abdominal pain, no back pain, no cough, no fever, no palpitations, no shortness of breath and no vomiting        Home Medications Prior to Admission medications   Medication Sig Start Date End Date Taking? Authorizing Provider  ALPRAZolam Prudy Feeler) 0.5 MG tablet Take 1 tablet (0.5 mg total) by mouth 2 (two) times daily as needed for anxiety. Patient taking differently: Take 0.5 mg by mouth as needed for anxiety. 11/14/23  Yes Pollina, Canary Brim, MD  famotidine (PEPCID) 20 MG tablet Take 1 tablet (20 mg total) by mouth 2 (two) times daily. 02/22/24  Yes Edwin Dada P, DO  hydrOXYzine (ATARAX) 25 MG tablet Take 25 mg by mouth as needed for anxiety. 02/12/24  Yes [provider]  MOUNJARO 7.5 MG/0.5ML Pen Inject 7.5 mg into the skin once a week. Mondays 02/21/24  Yes [provider]  pantoprazole (PROTONIX) 40 MG tablet Take 1 tablet (40 mg total) by mouth daily for 5 days. 02/22/24 02/27/24 Yes Edwin Dada P, DO  sertraline (ZOLOFT) 50 MG tablet Take 50 mg by mouth daily. 12/05/23  Yes [provider]  buPROPion (WELLBUTRIN SR) 100 MG 12 hr tablet Take 100 mg by  mouth 2 (two) times daily. 06/03/20   [provider]  HUMALOG KWIKPEN 100 UNIT/ML KwikPen Inject into the skin 3 (three) times daily. 08/30/22   [provider]  LEVEMIR FLEXPEN 100 UNIT/ML FlexPen Inject into the skin. 08/30/22   [provider]  lisinopril (ZESTRIL) 40 MG tablet Take 40 mg by mouth daily.    [provider]  lisinopril (ZESTRIL) 40 MG tablet Take 1 tablet (40 mg total) by mouth daily. 01/30/23   Darrick Grinder, PA-C  metFORMIN (GLUCOPHAGE-XR) 500 MG 24 hr tablet Take 1,000 mg by mouth in the morning and at bedtime. 06/22/20   [provider]  nitrofurantoin, macrocrystal-monohydrate, (MACROBID) 100 MG capsule Take 1 capsule (100 mg total) by mouth 2 (two) times daily. 02/09/24   Anders Simmonds T, DO  pantoprazole (PROTONIX) 20 MG tablet Take 20 mg by mouth daily. 10/24/22  Yes [provider]  rosuvastatin (CRESTOR) 20 MG tablet Take 20 mg by mouth at bedtime. 02/18/20  Yes [provider]  Semaglutide, 1 MG/DOSE, (OZEMPIC, 1 MG/DOSE,) 2 MG/1.5ML SOPN Inject 2 mg into the skin once a week. 08/03/20   [provider]  traZODone (DESYREL) 50 MG tablet Take 1 tablet (50 mg total) by mouth at bedtime and may repeat dose one time if needed. 12/01/23   Oneta Rack, NP      Allergies    Patient has no known allergies.  Review of Systems   Review of Systems  Constitutional:  Negative for chills and fever.  HENT:  Negative for ear pain and sore throat.   Eyes:  Negative for pain and visual disturbance.  Respiratory:  Negative for cough and shortness of breath.   Cardiovascular:  Positive for chest pain. Negative for palpitations.  Gastrointestinal:  Negative for abdominal pain and vomiting.  Genitourinary:  Negative for dysuria and hematuria.  Musculoskeletal:  Negative for arthralgias and back pain.  Skin:  Negative for color change and rash.  Neurological:  Negative for seizures and syncope.  All other  systems reviewed and are negative.   Physical Exam Updated Vital Signs BP 123/89   Pulse (!) 111   Temp 98.4 F (36.9 C) (Oral)   Resp 16   Wt 123.4 kg   LMP 02/06/2024 (Exact Date)   SpO2 100%   BMI 40.17 kg/m  Physical Exam Vitals and nursing note reviewed.  Constitutional:      General: She is not in acute distress.    Appearance: She is well-developed.  HENT:     Head: Normocephalic and atraumatic.  Eyes:     Conjunctiva/sclera: Conjunctivae normal.  Cardiovascular:     Rate and Rhythm: Normal rate and regular rhythm.     Heart sounds: No murmur heard. Pulmonary:     Effort: Pulmonary effort is normal. No respiratory distress.     Breath sounds: Normal breath sounds.  Abdominal:     Palpations: Abdomen is soft.     Tenderness: There is no abdominal tenderness.  Musculoskeletal:        General: No swelling.     Cervical back: Neck supple.  Skin:    General: Skin is warm and dry.     Capillary Refill: Capillary refill takes less than 2 seconds.  Neurological:     Mental Status: She is alert.  Psychiatric:        Mood and Affect: Mood normal.     ED Results / Procedures / Treatments   Labs (all labs ordered are listed, but only abnormal results are displayed) Labs Reviewed  BASIC METABOLIC PANEL WITH GFR - Abnormal; Notable for the following components:      Result Value   Glucose, Bld 154 (*)    All other components within normal limits  CBC - Abnormal; Notable for the following components:   Hemoglobin 11.4 (*)    HCT 35.9 (*)    MCV 74.5 (*)    MCH 23.7 (*)    All other components within normal limits  PREGNANCY, URINE  TROPONIN I (HIGH SENSITIVITY)    EKG None  Radiology DG Chest 2 View Result Date: 02/22/2024 CLINICAL DATA:  CP EXAM: CHEST - 2 VIEW COMPARISON:  02/10/2024 FINDINGS: Lungs clear. No pneumothorax. Electronic device overlies the left chest. Heart size and mediastinal contours are within normal limits. No effusion. Visualized  bones unremarkable. IMPRESSION: No acute cardiopulmonary disease. Electronically Signed   By: Corlis Leak M.D.   On: 02/22/2024 08:41    Procedures Procedures    Medications Ordered in ED Medications  famotidine (PEPCID) IVPB 20 mg premix (20 mg Intravenous New Bag/Given 02/22/24 0856)  pantoprazole (PROTONIX) EC tablet 40 mg (40 mg Oral Given 02/22/24 0839)  alum & mag hydroxide-simeth (MAALOX/MYLANTA) 200-200-20 MG/5ML suspension 15 mL (15 mLs Oral Given 02/22/24 0840)  ondansetron (ZOFRAN-ODT) disintegrating tablet 4 mg (4 mg Oral Given 02/22/24 1610)    ED Course/ Medical Decision Making/ A&P  Medical Decision Making Amount and/or Complexity of Data Reviewed Labs: ordered. Radiology: ordered.  Risk OTC drugs. Prescription drug management.   66:51 AM 33 year old female with past medical history of anxiety and GERD presenting for complaints of chest pain, nausea, and indigestion after being out of her pantoprazole for 4 days.  The patient's chest pain is not suggestive of pulmonary embolus, cardiac ischemia, aortic dissection, pericarditis, myocarditis, pulmonary embolism, pneumothorax, pneumonia, Zoster, or esophageal perforation, or other serious etiology.  Historically not abrupt in onset, tearing or ripping, pulses symmetric. EKG nonspecific for ischemia/infarction. No dysrhythmias, brugada, WPW, prolonged QT noted. CXR reviewed and WNL. Troponin negative x1. Labs stable.   Symptoms thought to be likely secondary to acid reflux.  Maalox, Pepcid, pantoprazole given.   On reevaluation of patient, she has significant improvement of symptoms.  She received her home medications from a mail order and they are delivering them on Monday.  I will cover her for the weekend.  Detailed discussions were had with the patient regarding current findings, and need for close f/u with PCP or on call doctor. The patient has been instructed to return immediately if the  symptoms worsen in any way for re-evaluation. Patient verbalized understanding and is in agreement with current care plan. All questions answered prior to discharge.         Final Clinical Impression(s) / ED Diagnoses Final diagnoses:  Gastroesophageal reflux disease, unspecified whether esophagitis present    Rx / DC Orders ED Discharge Orders          Ordered    pantoprazole (PROTONIX) 40 MG tablet  Daily        02/22/24 0920    famotidine (PEPCID) 20 MG tablet  2 times daily        02/22/24 0920              Franne Forts, DO 02/22/24 [phone]

## 2024-02-24 ENCOUNTER — Emergency Department (HOSPITAL_COMMUNITY): Payer: PRIVATE HEALTH INSURANCE

## 2024-02-24 ENCOUNTER — Encounter (HOSPITAL_COMMUNITY): Payer: Self-pay | Admitting: Emergency Medicine

## 2024-02-24 ENCOUNTER — Emergency Department (HOSPITAL_COMMUNITY)
Admission: EM | Admit: 2024-02-24 | Discharge: 2024-02-24 | Disposition: A | Discharge status: Home / Self Care | Payer: PRIVATE HEALTH INSURANCE | Attending: Emergency Medicine | Admitting: Emergency Medicine

## 2024-02-24 ENCOUNTER — Other Ambulatory Visit: Payer: Self-pay

## 2024-02-24 DIAGNOSIS — R519 Headache, unspecified: Secondary | ICD-10-CM | POA: Insufficient documentation

## 2024-02-24 DIAGNOSIS — K2100 Gastro-esophageal reflux disease with esophagitis, without bleeding: Secondary | ICD-10-CM | POA: Diagnosis not present

## 2024-02-24 LAB — CBC WITH DIFFERENTIAL/PLATELET
Abs Immature Granulocytes: 0.02 10*3/uL (ref 0.00–0.07)
Basophils Absolute: 0 10*3/uL (ref 0.0–0.1)
Basophils Relative: 0 %
Eosinophils Absolute: 0 10*3/uL (ref 0.0–0.5)
Eosinophils Relative: 1 %
HCT: 34.4 % — ABNORMAL LOW (ref 36.0–46.0)
Hemoglobin: 10.7 g/dL — ABNORMAL LOW (ref 12.0–15.0)
Immature Granulocytes: 0 %
Lymphocytes Relative: 46 %
Lymphs Abs: 2.5 10*3/uL (ref 0.7–4.0)
MCH: 23.7 pg — ABNORMAL LOW (ref 26.0–34.0)
MCHC: 31.1 g/dL (ref 30.0–36.0)
MCV: 76.1 fL — ABNORMAL LOW (ref 80.0–100.0)
Monocytes Absolute: 0.3 10*3/uL (ref 0.1–1.0)
Monocytes Relative: 6 %
Neutro Abs: 2.6 10*3/uL (ref 1.7–7.7)
Neutrophils Relative %: 47 %
Platelets: 328 10*3/uL (ref 150–400)
RBC: 4.52 MIL/uL (ref 3.87–5.11)
RDW: 14.8 % (ref 11.5–15.5)
WBC: 5.5 10*3/uL (ref 4.0–10.5)
nRBC: 0 % (ref 0.0–0.2)

## 2024-02-24 LAB — BASIC METABOLIC PANEL WITH GFR
Anion gap: 10 (ref 5–15)
BUN: 10 mg/dL (ref 6–20)
CO2: 20 mmol/L — ABNORMAL LOW (ref 22–32)
Calcium: 9.3 mg/dL (ref 8.9–10.3)
Chloride: 106 mmol/L (ref 98–111)
Creatinine, Ser: 0.74 mg/dL (ref 0.44–1.00)
GFR, Estimated: >60 mL/min (ref >60)
Glucose, Bld: 128 mg/dL — ABNORMAL HIGH (ref 70–99)
Potassium: 3.7 mmol/L (ref 3.5–5.1)
Sodium: 136 mmol/L (ref 135–145)

## 2024-02-24 MED ORDER — LORAZEPAM 2 MG/ML IJ SOLN
1.0000 mg | Freq: Once | INTRAMUSCULAR | Status: AC
  Administered 2024-02-24: 1 mg via INTRAVENOUS
  Filled 2024-02-24: qty 1

## 2024-02-24 MED ORDER — GADOBUTROL 1 MMOL/ML IV SOLN
10.0000 mL | Freq: Once | INTRAVENOUS | Status: AC | PRN
  Administered 2024-02-24: 10 mL via INTRAVENOUS

## 2024-02-24 NOTE — ED Provider Notes (Signed)
 [place] EMERGENCY DEPARTMENT AT [facility] Provider Note   CSN: [id number] Arrival date & time: 02/24/24  1419     History  Chief Complaint  Patient presents with   Dizziness   Nausea    Kelly Sweeney is a 33 y.o. female.  Patient complains of dizziness and a headache.  Patient was seen 2 days ago and had a CT scan of her head for the same.  Patient reports that she was given meclizine but has not had any relief.  Patient reports she has also had multiple episodes of reflux.  Patient is currently out of her pantoprazole.  She is taking Pepcid without relief.  Patient reports that she recently has been having increased anxiety.  Patient complains of difficulty concentrating.  Pt is requesting MRI. Pt complains of numbness in her left hand. Pt reports she has weakness in her legs.    The history is provided by the patient. No language interpreter was used.  Dizziness Quality:  Lightheadedness and vertigo Severity:  Moderate Onset quality:  Gradual Timing:  Constant Progression:  Worsening Chronicity:  New Context: not with loss of consciousness   Relieved by:  Nothing Worsened by:  Nothing Ineffective treatments:  None tried Associated symptoms: no weakness        Home Medications Prior to Admission medications   Medication Sig Start Date End Date Taking? Authorizing Provider  ALPRAZolam Prudy Feeler) 0.5 MG tablet Take 1 tablet (0.5 mg total) by mouth 2 (two) times daily as needed for anxiety. Patient taking differently: Take 0.5 mg by mouth as needed for anxiety. 11/14/23   Gilda Crease, MD  cetirizine (ZYRTEC) 10 MG tablet Take 10 mg by mouth daily. 01/12/24   [provider]  Cholecalciferol (D 1000) 25 MCG (1000 UT) capsule Take 1,000 Units by mouth daily. 07/04/23   [provider]  famotidine (PEPCID) 20 MG tablet Take 1 tablet (20 mg total) by mouth 2 (two) times daily. 02/22/24   Edwin Dada P, DO  fluticasone (FLONASE) 50  MCG/ACT nasal spray Place 2 sprays into both nostrils as needed for allergies. 01/01/24   [provider]  HUMALOG KWIKPEN 100 UNIT/ML KwikPen Inject 1-3 Units into the skin as needed (low blood sugar). 08/30/22   [provider]  hydrOXYzine (ATARAX) 25 MG tablet Take 25 mg by mouth as needed for anxiety. 02/12/24   [provider]  lisinopril (ZESTRIL) 40 MG tablet Take 1 tablet (40 mg total) by mouth daily. 01/30/23   Darrick Grinder, PA-C  meclizine (ANTIVERT) 25 MG tablet Take 1 tablet (25 mg total) by mouth 3 (three) times daily as needed for dizziness. 02/22/24   Vanetta Mulders, MD  metFORMIN (GLUCOPHAGE-XR) 500 MG 24 hr tablet Take 1,000 mg by mouth in the morning and at bedtime. 06/22/20   [provider]  MOUNJARO 7.5 MG/0.5ML Pen Inject 7.5 mg into the skin once a week. Mondays 02/21/24   [provider]  nitrofurantoin, macrocrystal-monohydrate, (MACROBID) 100 MG capsule Take 1 capsule (100 mg total) by mouth 2 (two) times daily. Patient not taking: Reported on 02/22/2024 02/09/24   Anders Simmonds T, DO  ondansetron (ZOFRAN) 4 MG tablet Take 1 tablet (4 mg total) by mouth every 6 (six) hours. 02/22/24   Edwin Dada P, DO  pantoprazole (PROTONIX) 40 MG tablet Take 1 tablet (40 mg total) by mouth daily for 5 days. 02/22/24 02/27/24  Edwin Dada P, DO  pantoprazole (PROTONIX) 40 MG tablet Take 40 mg  by mouth daily. 04/11/23 05/21/24  [provider]  rosuvastatin (CRESTOR) 20 MG tablet Take 20 mg by mouth at bedtime. 02/18/20   [provider]  sertraline (ZOLOFT) 50 MG tablet Take 50 mg by mouth daily. 12/05/23   [provider]  traZODone (DESYREL) 50 MG tablet Take 1 tablet (50 mg total) by mouth at bedtime and may repeat dose one time if needed. Patient not taking: Reported on 02/22/2024 12/01/23   Oneta Rack, NP      Allergies    Patient has no known allergies.    Review of Systems   Review of Systems  Neurological:   Positive for dizziness. Negative for weakness.  All other systems reviewed and are negative.   Physical Exam Updated Vital Signs BP 118/79   Pulse 91   Temp 98.1 F (36.7 C) (Oral)   Resp 18   LMP 02/06/2024 (Exact Date)   SpO2 100%  Physical Exam Vitals and nursing note reviewed.  Constitutional:      Appearance: She is well-developed.  HENT:     Head: Normocephalic.     Right Ear: Tympanic membrane normal.     Left Ear: Tympanic membrane normal.     Nose: Nose normal.     Mouth/Throat:     Mouth: Mucous membranes are moist.  Eyes:     Pupils: Pupils are equal, round, and reactive to light.  Cardiovascular:     Rate and Rhythm: Normal rate and regular rhythm.  Pulmonary:     Effort: Pulmonary effort is normal.  Abdominal:     General: There is no distension.  Musculoskeletal:        General: Normal range of motion.     Cervical back: Normal range of motion.  Skin:    General: Skin is warm.  Neurological:     General: No focal deficit present.     Mental Status: She is alert and oriented to person, place, and time.     ED Results / Procedures / Treatments   Labs (all labs ordered are listed, but only abnormal results are displayed) Labs Reviewed  CBC WITH DIFFERENTIAL/PLATELET - Abnormal; Notable for the following components:      Result Value   Hemoglobin 10.7 (*)    HCT 34.4 (*)    MCV 76.1 (*)    MCH 23.7 (*)    All other components within normal limits  BASIC METABOLIC PANEL WITH GFR - Abnormal; Notable for the following components:   CO2 20 (*)    Glucose, Bld 128 (*)    All other components within normal limits    EKG None  Radiology MR Cervical Spine W and Wo Contrast Result Date: 02/24/2024 CLINICAL DATA:  Multiple sclerosis (MS) evaluate for MS EXAM: MRI CERVICAL SPINE WITHOUT AND WITH CONTRAST TECHNIQUE: Multiplanar and multiecho pulse sequences of the cervical spine, to include the craniocervical junction and cervicothoracic junction, were  obtained without and with intravenous contrast. CONTRAST:  10mL GADAVIST GADOBUTROL 1 MMOL/ML IV SOLN COMPARISON:  None Available. FINDINGS: Alignment: Physiologic. Vertebrae: No fracture, evidence of discitis, or bone lesion. Cord: Normal cord signal.  No abnormal enhancement. Posterior Fossa, vertebral arteries, paraspinal tissues: Visualized vertebral artery flow voids are maintained. Disc levels: No significant disc protrusion, foraminal stenosis, or canal stenosis. IMPRESSION: 1. No abnormal cord signal or enhancement. 2. No significant stenosis. Electronically Signed   By: Feliberto Harts M.D.   On: 02/24/2024 21:11   MR Brain W and Wo Contrast Result  Date: 02/24/2024 CLINICAL DATA:  Syncope/presyncope, cerebrovascular cause suspected EXAM: MRI HEAD WITHOUT AND WITH CONTRAST TECHNIQUE: Multiplanar, multiecho pulse sequences of the brain and surrounding structures were obtained without and with intravenous contrast. CONTRAST:  10mL GADAVIST GADOBUTROL 1 MMOL/ML IV SOLN COMPARISON:  CT head February 22, 2024. FINDINGS: Brain: No acute infarction, hemorrhage, hydrocephalus, extra-axial collection or mass lesion. No pathologic enhancement. Vascular: Major arterial flow voids are maintained at the skull base. Skull and upper cervical spine: Normal marrow signal. Sinuses/Orbits: Negative. Other: No mastoid effusions. IMPRESSION: Normal brain MRI. No acute abnormality. Electronically Signed   By: Feliberto Harts M.D.   On: 02/24/2024 21:08    Procedures Procedures    Medications Ordered in ED Medications  gadobutrol (GADAVIST) 1 MMOL/ML injection 10 mL (10 mLs Intravenous Contrast Given 02/24/24 1824)  LORazepam (ATIVAN) injection 1 mg (1 mg Intravenous Given 02/24/24 1748)    ED Course/ Medical Decision Making/ A&P                                 Medical Decision Making Pt complains of numbness and weakness in extremities and headache  Amount and/or Complexity of Data Reviewed External Data  Reviewed: notes.    Details: Previous notes reviewed  Labs: ordered. Decision-making details documented in ED Course.    Details: Labs ordered reviewed and interpreted Hemoglobin is 10.7. glucose is 128 Radiology: ordered and independent interpretation performed. Decision-making details documented in ED Course.    Details: MRI brain and C spine  no acute abnormality.   Risk Prescription drug management. Risk Details: Pt advised to continue ativert.  Follow up with primary care for recheck            Final Clinical Impression(s) / ED Diagnoses Final diagnoses:  Acute nonintractable headache, unspecified headache type  Gastroesophageal reflux disease with esophagitis without hemorrhage    Rx / DC Orders ED Discharge Orders     None         Osie Cheeks 02/24/24 2259    Terald Sleeper, MD 02/24/24 2351

## 2024-02-24 NOTE — ED Triage Notes (Signed)
 Pt c/o dizziness nausea x 5days. Pt recently seen at [facility] for same complaints. Pt waiting on protonix proscription for home but has not come yet. Pt wearing heart monitor for heart palpitations.

## 2024-02-24 NOTE — Discharge Instructions (Addendum)
 You can try taking prilosec or nexium until you are able to get your protonix prescription.  The Mri of your head an neck are normal.  Follow up with your Physicain for recheck.

## 2024-03-23 ENCOUNTER — Encounter (HOSPITAL_BASED_OUTPATIENT_CLINIC_OR_DEPARTMENT_OTHER): Payer: Self-pay | Admitting: Emergency Medicine

## 2024-03-23 ENCOUNTER — Emergency Department (HOSPITAL_BASED_OUTPATIENT_CLINIC_OR_DEPARTMENT_OTHER): Payer: Self-pay

## 2024-03-23 ENCOUNTER — Emergency Department (HOSPITAL_BASED_OUTPATIENT_CLINIC_OR_DEPARTMENT_OTHER)
Admission: EM | Admit: 2024-03-23 | Discharge: 2024-03-23 | Disposition: A | Discharge status: Home / Self Care | Payer: Self-pay | Attending: Emergency Medicine | Admitting: Emergency Medicine

## 2024-03-23 ENCOUNTER — Other Ambulatory Visit: Payer: Self-pay

## 2024-03-23 DIAGNOSIS — R0789 Other chest pain: Secondary | ICD-10-CM | POA: Diagnosis present

## 2024-03-23 DIAGNOSIS — R5383 Other fatigue: Secondary | ICD-10-CM | POA: Diagnosis not present

## 2024-03-23 DIAGNOSIS — R5381 Other malaise: Secondary | ICD-10-CM | POA: Insufficient documentation

## 2024-03-23 DIAGNOSIS — Z794 Long term (current) use of insulin: Secondary | ICD-10-CM | POA: Diagnosis not present

## 2024-03-23 DIAGNOSIS — R079 Chest pain, unspecified: Secondary | ICD-10-CM

## 2024-03-23 DIAGNOSIS — R09A2 Foreign body sensation, throat: Secondary | ICD-10-CM | POA: Diagnosis not present

## 2024-03-23 LAB — BASIC METABOLIC PANEL WITH GFR
Anion gap: 15 (ref 5–15)
BUN: 8 mg/dL (ref 6–20)
CO2: 20 mmol/L — ABNORMAL LOW (ref 22–32)
Calcium: 9.8 mg/dL (ref 8.9–10.3)
Chloride: 105 mmol/L (ref 98–111)
Creatinine, Ser: 0.8 mg/dL (ref 0.44–1.00)
GFR, Estimated: >60 mL/min (ref >60)
Glucose, Bld: 133 mg/dL — ABNORMAL HIGH (ref 70–99)
Potassium: 3.9 mmol/L (ref 3.5–5.1)
Sodium: 139 mmol/L (ref 135–145)

## 2024-03-23 LAB — CBC WITH DIFFERENTIAL/PLATELET
Abs Immature Granulocytes: 0.02 10*3/uL (ref 0.00–0.07)
Basophils Absolute: 0 10*3/uL (ref 0.0–0.1)
Basophils Relative: 0 %
Eosinophils Absolute: 0.1 10*3/uL (ref 0.0–0.5)
Eosinophils Relative: 1 %
HCT: 33.4 % — ABNORMAL LOW (ref 36.0–46.0)
Hemoglobin: 10.4 g/dL — ABNORMAL LOW (ref 12.0–15.0)
Immature Granulocytes: 0 %
Lymphocytes Relative: 42 %
Lymphs Abs: 1.9 10*3/uL (ref 0.7–4.0)
MCH: 23.2 pg — ABNORMAL LOW (ref 26.0–34.0)
MCHC: 31.1 g/dL (ref 30.0–36.0)
MCV: 74.4 fL — ABNORMAL LOW (ref 80.0–100.0)
Monocytes Absolute: 0.3 10*3/uL (ref 0.1–1.0)
Monocytes Relative: 6 %
Neutro Abs: 2.4 10*3/uL (ref 1.7–7.7)
Neutrophils Relative %: 51 %
Platelets: 297 10*3/uL (ref 150–400)
RBC: 4.49 MIL/uL (ref 3.87–5.11)
RDW: 15.6 % — ABNORMAL HIGH (ref 11.5–15.5)
WBC: 4.7 10*3/uL (ref 4.0–10.5)
nRBC: 0 % (ref 0.0–0.2)

## 2024-03-23 LAB — TROPONIN T, HIGH SENSITIVITY: Troponin T High Sensitivity: <15 ng/L (ref <19)

## 2024-03-23 LAB — PREGNANCY, URINE: Preg Test, Ur: NEGATIVE

## 2024-03-23 NOTE — ED Provider Notes (Signed)
 [place] EMERGENCY DEPARTMENT AT [facility] Provider Note   CSN: [id number] Arrival date & time: 03/23/24  1440     History  Chief Complaint  Patient presents with   Chest Pain    Kelly Sweeney is a 33 y.o. female.  HPI Patient reports that she has had several different symptoms and she just feels "icky".  She reports symptoms have been going on for about 4 days.  She reports she has a sensation of a lump in the base of her throat.  It is worse when she lies down at night.  Often she has to sit up or prop herself up to feel comfortable.  She reports it might be her reflux.  She stopped taking her Protonix  because she was concerned about having restarted the Lexapro  and that any kind of medications might interact with that.  For this reason also, she has avoided taking her lisinopril  for about 4 days and noted that her blood pressure was getting up in the 150s over 90s.  She did take her lisinopril  dose this morning and her Protonix  dose.  Patient denies she has any focal chest pain or sharp chest pain.  She reports sometimes she feels like she is short of breath and thinks it might be anxiety.  She does not have any productive cough fever or bodyaches.  She reports sometimes she feels achy up under her ribs on both sides.  She denies any pain or swelling in her legs.  No pain in the calves.  No personal history of PE or DVT.  She reports she thinks a lot of the symptoms might be because she has been extremely anxious about the possibility of an adverse reaction between the Lexapro  which she has restarted for panic attacks and taking her other medications.    Home Medications Prior to Admission medications   Medication Sig Start Date End Date Taking? Authorizing Provider  ALPRAZolam  (XANAX ) 0.5 MG tablet Take 1 tablet (0.5 mg total) by mouth 2 (two) times daily as needed for anxiety. Patient taking differently: Take 0.5 mg by mouth as needed for anxiety. 11/14/23   Ballard Bongo, MD  cetirizine (ZYRTEC) 10 MG tablet Take 10 mg by mouth daily. 01/12/24   [provider]  Cholecalciferol (D 1000) 25 MCG (1000 UT) capsule Take 1,000 Units by mouth daily. 07/04/23   [provider]  famotidine  (PEPCID ) 20 MG tablet Take 1 tablet (20 mg total) by mouth 2 (two) times daily. 02/22/24   Owen Blowers P, DO  fluticasone (FLONASE) 50 MCG/ACT nasal spray Place 2 sprays into both nostrils as needed for allergies. 01/01/24   [provider]  HUMALOG KWIKPEN 100 UNIT/ML KwikPen Inject 1-3 Units into the skin as needed (low blood sugar). 08/30/22   [provider]  hydrOXYzine  (ATARAX ) 25 MG tablet Take 25 mg by mouth as needed for anxiety. 02/12/24   [provider]  lisinopril  (ZESTRIL ) 40 MG tablet Take 1 tablet (40 mg total) by mouth daily. 01/30/23   Elisa Guest, PA-C  meclizine  (ANTIVERT ) 25 MG tablet Take 1 tablet (25 mg total) by mouth 3 (three) times daily as needed for dizziness. 02/22/24   Zackowski, Scott, MD  metFORMIN (GLUCOPHAGE-XR) 500 MG 24 hr tablet Take 1,000 mg by mouth in the morning and at bedtime. 06/22/20   [provider]  MOUNJARO 7.5 MG/0.5ML Pen Inject 7.5 mg into the skin once a week. Mondays 02/21/24   [provider]  nitrofurantoin , macrocrystal-monohydrate, (MACROBID ) 100 MG capsule Take 1 capsule (100 mg total) by mouth 2 (two) times daily. Patient not taking: Reported on 02/22/2024 02/09/24   Afton Horse T, DO  ondansetron  (ZOFRAN ) 4 MG tablet Take 1 tablet (4 mg total) by mouth every 6 (six) hours. 02/22/24   Owen Blowers P, DO  pantoprazole  (PROTONIX ) 40 MG tablet Take 1 tablet (40 mg total) by mouth daily for 5 days. 02/22/24 02/27/24  Owen Blowers P, DO  pantoprazole  (PROTONIX ) 40 MG tablet Take 40 mg by mouth daily. 04/11/23 05/21/24  [provider]  rosuvastatin  (CRESTOR ) 20 MG tablet Take 20 mg by mouth at bedtime. 02/18/20   [provider]  sertraline (ZOLOFT) 50  MG tablet Take 50 mg by mouth daily. 12/05/23   [provider]  traZODone  (DESYREL ) 50 MG tablet Take 1 tablet (50 mg total) by mouth at bedtime and may repeat dose one time if needed. Patient not taking: Reported on 02/22/2024 12/01/23   Levester Reagin, NP      Allergies    Patient has no known allergies.    Review of Systems   Review of Systems  Physical Exam Updated Vital Signs BP 126/74   Pulse 62   Temp 99.2 F (37.3 C) (Oral)   Resp 16   Ht 5\' 9"  (1.753 m)   Wt 123.4 kg   SpO2 100%   BMI 40.17 kg/m  Physical Exam Constitutional:      Comments: Alert, nontoxic, no respiratory distress at rest.  Mildly anxious.  HENT:     Head: Normocephalic and atraumatic.     Mouth/Throat:     Pharynx: Oropharynx is clear.  Eyes:     Extraocular Movements: Extraocular movements intact.  Cardiovascular:     Rate and Rhythm: Normal rate and regular rhythm.     Heart sounds: Normal heart sounds.  Pulmonary:     Effort: Pulmonary effort is normal.     Breath sounds: Normal breath sounds.  Abdominal:     General: There is no distension.     Palpations: Abdomen is soft.     Tenderness: There is no abdominal tenderness. There is no guarding.  Musculoskeletal:        General: No swelling or tenderness. Normal range of motion.     Right lower leg: No edema.     Left lower leg: No edema.  Skin:    General: Skin is warm and dry.  Neurological:     General: No focal deficit present.     Mental Status: She is oriented to person, place, and time.     Motor: No weakness.     Coordination: Coordination normal.  Psychiatric:     Comments: Patient is interactive and appropriate.  With in-depth conversation, she does become slightly tearful but maintains composure.     ED Results / Procedures / Treatments   Labs (all labs ordered are listed, but only abnormal results are displayed) Labs Reviewed  BASIC METABOLIC PANEL WITH GFR - Abnormal; Notable for the following components:       Result Value   CO2 20 (*)    Glucose, Bld 133 (*)    All other components within normal limits  CBC WITH DIFFERENTIAL/PLATELET - Abnormal; Notable for the following components:   Hemoglobin 10.4 (*)    HCT 33.4 (*)    MCV 74.4 (*)    MCH 23.2 (*)    RDW 15.6 (*)    All other components within normal  limits  PREGNANCY, URINE  TROPONIN T, HIGH SENSITIVITY  TROPONIN T, HIGH SENSITIVITY    EKG EKG Interpretation Date/Time:  Sunday March 23 2024 14:48:34 EDT Ventricular Rate:  79 PR Interval:  153 QRS Duration:  85 QT Interval:  356 QTC Calculation: 408 R Axis:   36  Text Interpretation: Sinus rhythm Confirmed by Jerald Molly [phone]) on 03/23/2024 2:57:05 PM  Radiology DG Chest 2 View Result Date: 03/23/2024 CLINICAL DATA:  Chest pain EXAM: CHEST - 2 VIEW COMPARISON:  02/22/2024 FINDINGS: The heart size and mediastinal contours are within normal limits. Both lungs are clear. The visualized skeletal structures are unremarkable. IMPRESSION: No active cardiopulmonary disease. Electronically Signed   By: Melven Stable.  Shick M.D.   On: 03/23/2024 17:21    Procedures Procedures    Medications Ordered in ED Medications - No data to display  ED Course/ Medical Decision Making/ A&P                                 Medical Decision Making Amount and/or Complexity of Data Reviewed Labs: ordered. Radiology: ordered.   Patient presents with constellation of symptoms as outlined.  With some upper chest tightness and globus sensation, will obtain EKG and basic cardiac workup to rule out ACS.  At this time low suspicion for PE.  Patient has no typical symptoms does not have tachycardia no hypoxia and no risk factors. PERC negative.  Diagnostic workup within normal limits.  Troponin less than 15.  Chest x-ray interpreted by radiology no acute findings.  GFR normal.  At this time symptoms appear most likely secondary to reflux and possibly anxiety.  Patient is experiencing globus sensation  at night.  Patient is low risk for ACS or PE.  She did stop taking reflux medications and her lisinopril  due to anxiety about possible drug drug interactions with Lexapro .  Patient has been reassured and encouraged to continue the Protonix  and Lexapro  at this time along with her lisinopril .  She is encouraged to get close follow-up for recheck and return precautions reviewed.        Final Clinical Impression(s) / ED Diagnoses Final diagnoses:  Chest pain, unspecified type  Malaise and fatigue  Globus sensation    Rx / DC Orders ED Discharge Orders     None         Wynetta Heckle, MD 03/23/24 1739

## 2024-03-23 NOTE — ED Triage Notes (Signed)
 Pt c/o upper chest tightness x 4d; pain is worse when lying down; also reports lower back pain

## 2024-03-23 NOTE — ED Notes (Signed)
 Unsuccessful PIV attempt to right forearm x 1

## 2024-03-23 NOTE — Discharge Instructions (Signed)
 1.  At this time your lab work and evaluation looks good. 2.  Continue to take your Protonix  and lisinopril .  They should not have adverse reaction with your Lexapro . 3.  Follow-up closely with her doctor for continued monitoring. 4.  Return if you have new or concerning symptoms.

## 2024-03-23 NOTE — ED Notes (Signed)
 CBC, BMP and 1st trop collected at time of USIV insertion at 39. Disregard EMR collection of 1449, incorrectly timed by lab

## 2024-04-04 ENCOUNTER — Encounter (HOSPITAL_BASED_OUTPATIENT_CLINIC_OR_DEPARTMENT_OTHER): Payer: Self-pay | Admitting: Emergency Medicine

## 2024-04-04 ENCOUNTER — Other Ambulatory Visit: Payer: Self-pay

## 2024-04-04 ENCOUNTER — Emergency Department (HOSPITAL_BASED_OUTPATIENT_CLINIC_OR_DEPARTMENT_OTHER): Payer: Self-pay

## 2024-04-04 ENCOUNTER — Emergency Department (HOSPITAL_BASED_OUTPATIENT_CLINIC_OR_DEPARTMENT_OTHER)
Admission: EM | Admit: 2024-04-04 | Discharge: 2024-04-05 | Disposition: A | Discharge status: Home / Self Care | Payer: Self-pay | Attending: Emergency Medicine | Admitting: Emergency Medicine

## 2024-04-04 DIAGNOSIS — M25511 Pain in right shoulder: Secondary | ICD-10-CM | POA: Diagnosis present

## 2024-04-04 DIAGNOSIS — M79642 Pain in left hand: Secondary | ICD-10-CM | POA: Diagnosis not present

## 2024-04-04 DIAGNOSIS — M25521 Pain in right elbow: Secondary | ICD-10-CM | POA: Insufficient documentation

## 2024-04-04 NOTE — ED Triage Notes (Signed)
 Pt presents with right shoulder pain radiating down to right forearm.  Pt reports she was lying down when pain began.  No known injury.

## 2024-04-04 NOTE — ED Provider Notes (Signed)
 [place] EMERGENCY DEPARTMENT AT [facility] Provider Note   CSN: [id number] Arrival date & time: 04/04/24  2013     History Chief Complaint  Patient presents with   Shoulder Pain    HPI Kelly Sweeney is a 33 y.o. female presenting for RIGHT shoulder and wrist pain going down her side. States that is has been aching and started today Denies fevers chills nausea vomiting syncope/SOB  Patient's recorded medical, surgical, social, medication list and allergies were reviewed in the Snapshot window as part of the initial history.   Review of Systems   Review of Systems  Constitutional:  Negative for chills and fever.  HENT:  Negative for ear pain and sore throat.   Eyes:  Negative for pain and visual disturbance.  Respiratory:  Negative for cough and shortness of breath.   Cardiovascular:  Negative for chest pain and palpitations.  Gastrointestinal:  Negative for abdominal pain and vomiting.  Genitourinary:  Negative for dysuria and hematuria.  Musculoskeletal:  Positive for arthralgias. Negative for back pain.  Skin:  Negative for color change and rash.  Neurological:  Negative for seizures and syncope.  All other systems reviewed and are negative.   Physical Exam Updated Vital Signs BP 128/81 (BP Location: Left Arm)   Pulse 79   Temp 98 F (36.7 C) (Oral)   Resp 16   LMP 04/04/2024   SpO2 100%  Physical Exam Vitals and nursing note reviewed.  Constitutional:      General: She is not in acute distress.    Appearance: She is well-developed.  HENT:     Head: Normocephalic and atraumatic.  Eyes:     Conjunctiva/sclera: Conjunctivae normal.  Cardiovascular:     Rate and Rhythm: Normal rate and regular rhythm.     Heart sounds: No murmur heard. Pulmonary:     Effort: Pulmonary effort is normal. No respiratory distress.     Breath sounds: Normal breath sounds.  Abdominal:     General: There is no distension.     Palpations: Abdomen is soft.      Tenderness: There is no abdominal tenderness. There is no right CVA tenderness or left CVA tenderness.  Musculoskeletal:        General: No swelling or tenderness. Normal range of motion.     Cervical back: Neck supple.  Skin:    General: Skin is warm and dry.  Neurological:     General: No focal deficit present.     Mental Status: She is alert and oriented to person, place, and time. Mental status is at baseline.     Cranial Nerves: No cranial nerve deficit.      ED Course/ Medical Decision Making/ A&P    Procedures Procedures   Medications Ordered in ED Medications - No data to display  Medical Decision Making:   Kelly Sweeney is a 33 y.o. female who presented to the ED today with diffuse joint detailed above.    Patient placed on continuous vitals and telemetry monitoring while in ED which was reviewed periodically.  Complete initial physical exam performed, notably the patient  was HDS in NAD.    Reviewed and confirmed nursing documentation for past medical history, family history, social history.    Initial Assessment:   With the patient's presentation of ***, most likely diagnosis is ***. Other diagnoses were considered including (but not limited to) ***. These are considered less likely due to history of present illness and physical exam findings.   {crccopa:27899}  Initial Plan:  ***  ***Screening labs including CBC and Metabolic panel to evaluate for infectious or metabolic etiology of disease.  ***Urinalysis with reflex culture ordered to evaluate for UTI or relevant urologic/nephrologic pathology.  ***CXR to evaluate for structural/infectious intrathoracic pathology.  {crccardiactesting:32591::"EKG to evaluate for cardiac pathology"} Objective evaluation as below reviewed   Initial Study Results:   Laboratory  All laboratory results reviewed without evidence of clinically relevant pathology.   ***Exceptions include: ***   ***EKG EKG was reviewed  independently. Rate, rhythm, axis, intervals all examined and without medically relevant abnormality. ST segments without concerns for elevations.    Radiology:  All images reviewed independently. ***Agree with radiology report at this time.   DG Shoulder Right Result Date: 04/04/2024 CLINICAL DATA:  Atraumatic right shoulder pain. EXAM: RIGHT SHOULDER - 2+ VIEW COMPARISON:  None Available. FINDINGS: There is no evidence of fracture or dislocation. There is no evidence of arthropathy or other focal bone abnormality. Soft tissues are unremarkable. IMPRESSION: Negative. Electronically Signed   By: Virgle Grime M.D.   On: 04/04/2024 22:11   DG Chest 2 View Result Date: 03/23/2024 CLINICAL DATA:  Chest pain EXAM: CHEST - 2 VIEW COMPARISON:  02/22/2024 FINDINGS: The heart size and mediastinal contours are within normal limits. Both lungs are clear. The visualized skeletal structures are unremarkable. IMPRESSION: No active cardiopulmonary disease. Electronically Signed   By: Melven Stable.  Shick M.D.   On: 03/23/2024 17:21      Consults: Case discussed with ***.   Reassessment and Plan:   ***    ***  Clinical Impression: No diagnosis found.   Data Unavailable   Final Clinical Impression(s) / ED Diagnoses Final diagnoses:  None    Rx / DC Orders ED Discharge Orders     None

## 2024-04-05 ENCOUNTER — Other Ambulatory Visit: Payer: Self-pay

## 2024-04-05 MED ORDER — CELECOXIB 200 MG PO CAPS: 200.0000 mg | ORAL_CAPSULE | Freq: Two times a day (BID) | ORAL | 0 refills | Status: AC

## 2024-04-05 NOTE — Discharge Instructions (Addendum)
 I am concerned for developing Rheumatoid arthritis. Cousin has RA/Lupus Patient is endorsing morning joint stiffness and pain that improves throughout the day.

## 2024-09-28 ENCOUNTER — Encounter (HOSPITAL_BASED_OUTPATIENT_CLINIC_OR_DEPARTMENT_OTHER): Payer: Self-pay | Admitting: Emergency Medicine

## 2024-09-28 ENCOUNTER — Other Ambulatory Visit: Payer: Self-pay

## 2024-09-28 ENCOUNTER — Emergency Department (HOSPITAL_BASED_OUTPATIENT_CLINIC_OR_DEPARTMENT_OTHER)
Admission: EM | Admit: 2024-09-28 | Discharge: 2024-09-28 | Disposition: A | Discharge status: Home / Self Care | Attending: Emergency Medicine | Admitting: Emergency Medicine

## 2024-09-28 ENCOUNTER — Emergency Department (HOSPITAL_BASED_OUTPATIENT_CLINIC_OR_DEPARTMENT_OTHER)

## 2024-09-28 DIAGNOSIS — I10 Essential (primary) hypertension: Secondary | ICD-10-CM | POA: Diagnosis not present

## 2024-09-28 DIAGNOSIS — E119 Type 2 diabetes mellitus without complications: Secondary | ICD-10-CM | POA: Diagnosis not present

## 2024-09-28 DIAGNOSIS — Z7984 Long term (current) use of oral hypoglycemic drugs: Secondary | ICD-10-CM | POA: Insufficient documentation

## 2024-09-28 DIAGNOSIS — R202 Paresthesia of skin: Secondary | ICD-10-CM | POA: Insufficient documentation

## 2024-09-28 DIAGNOSIS — R200 Anesthesia of skin: Secondary | ICD-10-CM | POA: Diagnosis present

## 2024-09-28 DIAGNOSIS — Z794 Long term (current) use of insulin: Secondary | ICD-10-CM | POA: Diagnosis not present

## 2024-09-28 DIAGNOSIS — Z79899 Other long term (current) drug therapy: Secondary | ICD-10-CM | POA: Insufficient documentation

## 2024-09-28 HISTORY — DX: Personality disorder, unspecified: F60.9

## 2024-09-28 LAB — BASIC METABOLIC PANEL WITH GFR
Anion gap: 12 (ref 5–15)
BUN: 11 mg/dL (ref 6–20)
CO2: 22 mmol/L (ref 22–32)
Calcium: 9.4 mg/dL (ref 8.9–10.3)
Chloride: 108 mmol/L (ref 98–111)
Creatinine, Ser: 0.77 mg/dL (ref 0.44–1.00)
GFR, Estimated: >60 mL/min (ref >60)
Glucose, Bld: 146 mg/dL — ABNORMAL HIGH (ref 70–99)
Potassium: 3.9 mmol/L (ref 3.5–5.1)
Sodium: 141 mmol/L (ref 135–145)

## 2024-09-28 LAB — CBC WITH DIFFERENTIAL/PLATELET
Abs Immature Granulocytes: 0.02 K/uL (ref 0.00–0.07)
Basophils Absolute: 0 K/uL (ref 0.0–0.1)
Basophils Relative: 0 %
Eosinophils Absolute: 0.1 K/uL (ref 0.0–0.5)
Eosinophils Relative: 1 %
HCT: 31 % — ABNORMAL LOW (ref 36.0–46.0)
Hemoglobin: 9.5 g/dL — ABNORMAL LOW (ref 12.0–15.0)
Immature Granulocytes: 1 %
Lymphocytes Relative: 54 %
Lymphs Abs: 2.3 K/uL (ref 0.7–4.0)
MCH: 22.2 pg — ABNORMAL LOW (ref 26.0–34.0)
MCHC: 30.6 g/dL (ref 30.0–36.0)
MCV: 72.6 fL — ABNORMAL LOW (ref 80.0–100.0)
Monocytes Absolute: 0.2 K/uL (ref 0.1–1.0)
Monocytes Relative: 6 %
Neutro Abs: 1.6 K/uL — ABNORMAL LOW (ref 1.7–7.7)
Neutrophils Relative %: 38 %
Platelets: 265 K/uL (ref 150–400)
RBC: 4.27 MIL/uL (ref 3.87–5.11)
RDW: 16.2 % — ABNORMAL HIGH (ref 11.5–15.5)
WBC: 4.2 K/uL (ref 4.0–10.5)
nRBC: 0 % (ref 0.0–0.2)

## 2024-09-28 LAB — HEPATIC FUNCTION PANEL
ALT: 14 U/L (ref 0–44)
AST: 11 U/L — ABNORMAL LOW (ref 15–41)
Albumin: 4.2 g/dL (ref 3.5–5.0)
Alkaline Phosphatase: 66 U/L (ref 38–126)
Bilirubin, Direct: <0.1 mg/dL (ref 0.0–0.2)
Total Bilirubin: <0.2 mg/dL (ref 0.0–1.2)
Total Protein: 7.3 g/dL (ref 6.5–8.1)

## 2024-09-28 NOTE — Discharge Instructions (Addendum)
 Return if symptoms worsen.  Follow-up with your primary care doctor and your psychiatry team.

## 2024-09-28 NOTE — ED Provider Notes (Signed)
 [facility] Provider Note   CSN: [id number] Arrival date & time: 09/28/24  1501     Patient presents with: Numbness   Kelly Sweeney is a 33 y.o. female.   Patient here with anxiety numbness weakness.  History of anxiety panic attacks restarted Lexapro .  Take Xanax  as needed.  But has not taken any today.  She has had recurrent episodes of numbness and tingling in her face and arms and legs at times.  Is not having any of those symptoms now mostly resolved.  She is having tingling to both sides of her face on and off for the last couple days sometimes tingling in the right arm and weakness in the arm.  She is tearful on exam.  She denies SI HI.  She follows with a psychiatrist.  She denies any headache vision loss speech changes coordination issues or balance issues.  She has never had vision loss with this.  She has a history of diabetes hypertension anxiety depression and personality disorder.  The history is provided by the patient.       Prior to Admission medications   Medication Sig Start Date End Date Taking? Authorizing Provider  ALPRAZolam  (XANAX ) 0.5 MG tablet Take 1 tablet (0.5 mg total) by mouth 2 (two) times daily as needed for anxiety. Patient taking differently: Take 0.5 mg by mouth as needed for anxiety. 11/14/23   Haze Lonni PARAS, MD  celecoxib  (CELEBREX ) 200 MG capsule Take 1 capsule (200 mg total) by mouth 2 (two) times daily. 04/04/24   Jerral Meth, MD  cetirizine (ZYRTEC) 10 MG tablet Take 10 mg by mouth daily. 01/12/24   [provider]  Cholecalciferol (D 1000) 25 MCG (1000 UT) capsule Take 1,000 Units by mouth daily. 07/04/23   [provider]  famotidine  (PEPCID ) 20 MG tablet Take 1 tablet (20 mg total) by mouth 2 (two) times daily. 02/22/24   Elnor Hila P, DO  fluticasone (FLONASE) 50 MCG/ACT nasal spray Place 2 sprays into both nostrils as needed for allergies. 01/01/24   [provider]  HUMALOG KWIKPEN 100 UNIT/ML KwikPen Inject 1-3 Units into the skin as needed (low blood sugar). 08/30/22   [provider]  hydrOXYzine  (ATARAX ) 25 MG tablet Take 25 mg by mouth as needed for anxiety. 02/12/24   [provider]  lisinopril  (ZESTRIL ) 40 MG tablet Take 1 tablet (40 mg total) by mouth daily. 01/30/23   Logan Ubaldo NOVAK, PA-C  meclizine  (ANTIVERT ) 25 MG tablet Take 1 tablet (25 mg total) by mouth 3 (three) times daily as needed for dizziness. 02/22/24   Zackowski, Scott, MD  metFORMIN (GLUCOPHAGE-XR) 500 MG 24 hr tablet Take 1,000 mg by mouth in the morning and at bedtime. 06/22/20   [provider]  MOUNJARO 7.5 MG/0.5ML Pen Inject 7.5 mg into the skin once a week. Mondays 02/21/24   [provider]  nitrofurantoin , macrocrystal-monohydrate, (MACROBID ) 100 MG capsule Take 1 capsule (100 mg total) by mouth 2 (two) times daily. Patient not taking: Reported on 02/22/2024 02/09/24   Mannie Pac T, DO  ondansetron  (ZOFRAN ) 4 MG tablet Take 1 tablet (4 mg total) by mouth every 6 (six) hours. 02/22/24   Elnor Hila P, DO  pantoprazole  (PROTONIX ) 40 MG tablet Take 1 tablet (40 mg total) by mouth daily for 5 days. 02/22/24 02/27/24  Elnor Hila P, DO  pantoprazole  (PROTONIX ) 40 MG tablet Take 40 mg by mouth daily. 04/11/23 05/21/24  [provider]  rosuvastatin  (CRESTOR ) 20 MG tablet Take 20 mg by mouth at bedtime. 02/18/20   [provider]  sertraline (ZOLOFT) 50 MG tablet Take 50 mg by mouth daily. 12/05/23   [provider]  traZODone  (DESYREL ) 50 MG tablet Take 1 tablet (50 mg total) by mouth at bedtime and may repeat dose one time if needed. Patient not taking: Reported on 02/22/2024 12/01/23   Ezzard Staci SAILOR, NP    Allergies: Patient has no known allergies.    Review of Systems  Updated Vital Signs BP (!) 133/93 (BP Location: Right Arm)   Pulse (!) 110   Temp 98.2 F (36.8 C) (Oral)   Resp 18   LMP 09/28/2024  (Exact Date)   SpO2 100%   Physical Exam Vitals and nursing note reviewed.  Constitutional:      General: She is not in acute distress.    Appearance: She is well-developed. She is not ill-appearing.  HENT:     Head: Normocephalic and atraumatic.     Nose: Nose normal.     Mouth/Throat:     Mouth: Mucous membranes are moist.  Eyes:     Extraocular Movements: Extraocular movements intact.     Conjunctiva/sclera: Conjunctivae normal.     Pupils: Pupils are equal, round, and reactive to light.  Cardiovascular:     Rate and Rhythm: Normal rate and regular rhythm.     Pulses: Normal pulses.     Heart sounds: Normal heart sounds. No murmur heard. Pulmonary:     Effort: Pulmonary effort is normal. No respiratory distress.     Breath sounds: Normal breath sounds.  Abdominal:     General: Abdomen is flat.     Palpations: Abdomen is soft.     Tenderness: There is no abdominal tenderness.  Musculoskeletal:        General: No swelling.     Cervical back: Normal range of motion and neck supple.  Skin:    General: Skin is warm and dry.     Capillary Refill: Capillary refill takes less than 2 seconds.  Neurological:     Mental Status: She is alert.     Comments: 5+ out of 5 strength throughout, normal sensation, normal coordination normal visual fields normal speech normal cranial nerves  Psychiatric:     Comments: Tearful anxious, denies SI HI     (all labs ordered are listed, but only abnormal results are displayed) Labs Reviewed  CBC WITH DIFFERENTIAL/PLATELET - Abnormal; Notable for the following components:      Result Value   Hemoglobin 9.5 (*)    HCT 31.0 (*)    MCV 72.6 (*)    MCH 22.2 (*)    RDW 16.2 (*)    Neutro Abs 1.6 (*)    All other components within normal limits  BASIC METABOLIC PANEL WITH GFR - Abnormal; Notable for the following components:   Glucose, Bld 146 (*)    All other components within normal limits  HEPATIC FUNCTION PANEL - Abnormal; Notable for  the following components:   AST 11 (*)    All other components within normal limits    EKG: EKG Interpretation Date/Time:  Sunday September 28 2024 15:12:05 EST Ventricular Rate:  109 PR Interval:  158 QRS Duration:  87 QT Interval:  316 QTC Calculation: 426 R Axis:   48  Text Interpretation: Sinus tachycardia B Confirmed by Ruthe Cornet [phone]) on 09/28/2024 3:19:06 PM  Radiology: CT Head Wo Contrast Result Date: 09/28/2024 EXAM:  CT HEAD WITHOUT CONTRAST 09/28/2024 04:08:08 PM TECHNIQUE: CT of the head was performed without the administration of intravenous contrast. Automated exposure control, iterative reconstruction, and/or weight based adjustment of the mA/kV was utilized to reduce the radiation dose to as low as reasonably achievable. COMPARISON: Head CT 02/22/2024 and MRI 02/24/2024. CLINICAL HISTORY: Headache, increasing frequency or severity. Bilateral facial numbness for 2 days as well as right arm weakness. FINDINGS: LIMITATIONS: The examination is mildly to moderately motion degraded. BRAIN AND VENTRICLES: There is no evidence of an acute infarct, intracranial hemorrhage, mass, midline shift, hydrocephalus, or extra-axial fluid collection. Mildly prominent frontoparietal sulci are unchanged and suggestive of mild cerebral atrophy. The ventricles are normal in size. ORBITS: No acute abnormality. SINUSES: No acute abnormality. SOFT TISSUES AND SKULL: No acute soft tissue abnormality. No skull fracture. IMPRESSION: 1. No acute intracranial abnormality. Electronically signed by: Dasie Hamburg MD 09/28/2024 04:44 PM EST RP Workstation: [id number]     Procedures   Medications Ordered in the ED - No data to display                                  Medical Decision Making Amount and/or Complexity of Data Reviewed Labs: ordered. Radiology: ordered.   Kelly Sweeney is here with numbness.  Normal vitals.  No fever.  EKG shows sinus rhythm per my review interpretation of EKG.   History of diabetes hypertension anxiety.  Just restarted Lexapro .  Takes Xanax  as needed for panic attacks.  She is describing more panic anxiety type symptoms.  She has a normal neurological exam.  She gets tingling in her face and arms at times when she is feeling very anxious.  I have no concern for stroke MS or other acute neurologic process but will get a head CT to make sure there is no acute intracranial process but she has normal strength and sensation normal visual fields.  She is feeling much better after symptoms are now resolved.  She is working with a therapist, sports.  She restarted Lexapro .  Overall we will check basic labs head CT.  Dissipate discharge.  Overall lab work shows no significant leukocytosis anemia or electrolyte abnormality.  Head CT is unremarkable.  Overall do suspect stress related process causing paresthesias.  Follow-up with primary care and psychiatry.  Return if symptoms worsen.  This chart was dictated using voice recognition software.  Despite best efforts to proofread,  errors can occur which can change the documentation meaning.      Final diagnoses:  Paresthesia    ED Discharge Orders     None          Ruthe Cornet, DO 09/28/24 1648

## 2024-09-28 NOTE — ED Triage Notes (Signed)
 Pt reports numbness to bilateral sides of face x 2 days and weakness to right arm.  Pt is tearful and reports she does have a hx of anxiety and wonders if this is that.

## 2024-11-29 ENCOUNTER — Encounter (HOSPITAL_BASED_OUTPATIENT_CLINIC_OR_DEPARTMENT_OTHER): Payer: Self-pay | Admitting: Emergency Medicine

## 2024-11-29 ENCOUNTER — Emergency Department (HOSPITAL_BASED_OUTPATIENT_CLINIC_OR_DEPARTMENT_OTHER)
Admission: EM | Admit: 2024-11-29 | Discharge: 2024-11-29 | Disposition: A | Discharge status: Home / Self Care | Attending: Emergency Medicine | Admitting: Emergency Medicine

## 2024-11-29 ENCOUNTER — Other Ambulatory Visit: Payer: Self-pay

## 2024-11-29 DIAGNOSIS — I10 Essential (primary) hypertension: Secondary | ICD-10-CM | POA: Insufficient documentation

## 2024-11-29 DIAGNOSIS — D649 Anemia, unspecified: Secondary | ICD-10-CM | POA: Insufficient documentation

## 2024-11-29 DIAGNOSIS — Z7984 Long term (current) use of oral hypoglycemic drugs: Secondary | ICD-10-CM | POA: Diagnosis not present

## 2024-11-29 DIAGNOSIS — R5383 Other fatigue: Secondary | ICD-10-CM | POA: Diagnosis present

## 2024-11-29 DIAGNOSIS — Z79899 Other long term (current) drug therapy: Secondary | ICD-10-CM | POA: Insufficient documentation

## 2024-11-29 DIAGNOSIS — E119 Type 2 diabetes mellitus without complications: Secondary | ICD-10-CM | POA: Insufficient documentation

## 2024-11-29 DIAGNOSIS — E860 Dehydration: Secondary | ICD-10-CM | POA: Diagnosis not present

## 2024-11-29 LAB — CBC WITH DIFFERENTIAL/PLATELET
Abs Immature Granulocytes: 0.01 K/uL (ref 0.00–0.07)
Basophils Absolute: 0 K/uL (ref 0.0–0.1)
Basophils Relative: 0 %
Eosinophils Absolute: 0 K/uL (ref 0.0–0.5)
Eosinophils Relative: 1 %
HCT: 35.2 % — ABNORMAL LOW (ref 36.0–46.0)
Hemoglobin: 10.9 g/dL — ABNORMAL LOW (ref 12.0–15.0)
Immature Granulocytes: 0 %
Lymphocytes Relative: 45 %
Lymphs Abs: 2.5 K/uL (ref 0.7–4.0)
MCH: 22.7 pg — ABNORMAL LOW (ref 26.0–34.0)
MCHC: 31 g/dL (ref 30.0–36.0)
MCV: 73.2 fL — ABNORMAL LOW (ref 80.0–100.0)
Monocytes Absolute: 0.3 K/uL (ref 0.1–1.0)
Monocytes Relative: 5 %
Neutro Abs: 2.7 K/uL (ref 1.7–7.7)
Neutrophils Relative %: 49 %
Platelets: 267 K/uL (ref 150–400)
RBC: 4.81 MIL/uL (ref 3.87–5.11)
RDW: 16.3 % — ABNORMAL HIGH (ref 11.5–15.5)
WBC: 5.6 K/uL (ref 4.0–10.5)
nRBC: 0 % (ref 0.0–0.2)

## 2024-11-29 LAB — BASIC METABOLIC PANEL WITH GFR
Anion gap: 13 (ref 5–15)
BUN: 9 mg/dL (ref 6–20)
CO2: 21 mmol/L — ABNORMAL LOW (ref 22–32)
Calcium: 9.7 mg/dL (ref 8.9–10.3)
Chloride: 106 mmol/L (ref 98–111)
Creatinine, Ser: 0.81 mg/dL (ref 0.44–1.00)
GFR, Estimated: >60 mL/min
Glucose, Bld: 117 mg/dL — ABNORMAL HIGH (ref 70–99)
Potassium: 3.9 mmol/L (ref 3.5–5.1)
Sodium: 140 mmol/L (ref 135–145)

## 2024-11-29 LAB — MAGNESIUM: Magnesium: 2.2 mg/dL (ref 1.7–2.4)

## 2024-11-29 MED ORDER — SODIUM CHLORIDE 0.9 % IV BOLUS
1000.0000 mL | Freq: Once | INTRAVENOUS | Status: AC
  Administered 2024-11-29: 1000 mL via INTRAVENOUS

## 2024-11-29 NOTE — ED Provider Notes (Signed)
 " [place] [facility] Provider Note   CSN: [id number] Arrival date & time: 11/29/24  1049     Patient presents with: Fatigue   Kelly Sweeney is a 34 y.o. female.   34 year old female presents today for concern of fatigue and feeling sluggish for the past 3 weeks.  Has a PCP appointment coming up on Monday but stated he just needed to be seen sooner because she was not feeling well.  Denies any URI symptoms.  Past medical history significant for hypertension, depression, anxiety, diabetes.  She is taking Ozempic.  No recent dose change.  The history is provided by the patient. No language interpreter was used.       Prior to Admission medications  Medication Sig Start Date End Date Taking? Authorizing Provider  ALPRAZolam  (XANAX ) 0.5 MG tablet Take 1 tablet (0.5 mg total) by mouth 2 (two) times daily as needed for anxiety. Patient taking differently: Take 0.5 mg by mouth as needed for anxiety. 11/14/23   Haze Lonni PARAS, MD  celecoxib  (CELEBREX ) 200 MG capsule Take 1 capsule (200 mg total) by mouth 2 (two) times daily. 04/04/24   Jerral Meth, MD  cetirizine (ZYRTEC) 10 MG tablet Take 10 mg by mouth daily. 01/12/24   [provider]  Cholecalciferol (D 1000) 25 MCG (1000 UT) capsule Take 1,000 Units by mouth daily. 07/04/23   [provider]  famotidine  (PEPCID ) 20 MG tablet Take 1 tablet (20 mg total) by mouth 2 (two) times daily. 02/22/24   Elnor Hila P, DO  fluticasone (FLONASE) 50 MCG/ACT nasal spray Place 2 sprays into both nostrils as needed for allergies. 01/01/24   [provider]  HUMALOG KWIKPEN 100 UNIT/ML KwikPen Inject 1-3 Units into the skin as needed (low blood sugar). 08/30/22   [provider]  hydrOXYzine  (ATARAX ) 25 MG tablet Take 25 mg by mouth as needed for anxiety. 02/12/24   [provider]  lisinopril  (ZESTRIL ) 40 MG tablet Take 1 tablet (40 mg total) by mouth daily. 01/30/23    Logan Ubaldo NOVAK, PA-C  meclizine  (ANTIVERT ) 25 MG tablet Take 1 tablet (25 mg total) by mouth 3 (three) times daily as needed for dizziness. 02/22/24   Zackowski, Scott, MD  metFORMIN (GLUCOPHAGE-XR) 500 MG 24 hr tablet Take 1,000 mg by mouth in the morning and at bedtime. 06/22/20   [provider]  MOUNJARO 7.5 MG/0.5ML Pen Inject 7.5 mg into the skin once a week. Mondays 02/21/24   [provider]  nitrofurantoin , macrocrystal-monohydrate, (MACROBID ) 100 MG capsule Take 1 capsule (100 mg total) by mouth 2 (two) times daily. Patient not taking: Reported on 02/22/2024 02/09/24   Mannie Pac T, DO  ondansetron  (ZOFRAN ) 4 MG tablet Take 1 tablet (4 mg total) by mouth every 6 (six) hours. 02/22/24   Elnor Hila P, DO  pantoprazole  (PROTONIX ) 40 MG tablet Take 1 tablet (40 mg total) by mouth daily for 5 days. 02/22/24 02/27/24  Elnor Hila P, DO  pantoprazole  (PROTONIX ) 40 MG tablet Take 40 mg by mouth daily. 04/11/23 05/21/24  [provider]  rosuvastatin  (CRESTOR ) 20 MG tablet Take 20 mg by mouth at bedtime. 02/18/20   [provider]  sertraline (ZOLOFT) 50 MG tablet Take 50 mg by mouth daily. 12/05/23   [provider]  traZODone  (DESYREL ) 50 MG tablet Take 1 tablet (50 mg total) by mouth at bedtime and may repeat dose one time if needed. Patient not taking: Reported on 02/22/2024 12/01/23  Ezzard Staci SAILOR, NP    Allergies: Patient has no known allergies.    Review of Systems  Constitutional:  Positive for fatigue. Negative for chills and fever.  Gastrointestinal:  Negative for abdominal pain.  Neurological:  Negative for weakness, light-headedness and headaches.  All other systems reviewed and are negative.   Updated Vital Signs BP 125/89 (BP Location: Right Arm)   Pulse (!) 104   Temp 98.6 F (37 C) (Oral)   Resp 18   Ht 5' 9 (1.753 m)   Wt 127 kg   LMP 11/27/2024 (Exact Date)   SpO2 100%   BMI 41.35 kg/m   Physical Exam Vitals and  nursing note reviewed.  Constitutional:      General: She is not in acute distress.    Appearance: Normal appearance. She is not ill-appearing.  HENT:     Head: Normocephalic and atraumatic.     Nose: Nose normal.  Eyes:     Conjunctiva/sclera: Conjunctivae normal.  Pulmonary:     Effort: Pulmonary effort is normal. No respiratory distress.  Musculoskeletal:        General: No deformity.     Comments: Good range of motion in bilateral upper and lower extremities with 5/5 strength in extensor and flexor muscle groups.  Paperless without difficulty.  Cervical, thoracic and lumbar spine without tenderness to palpation.  Skin:    Findings: No rash.  Neurological:     General: No focal deficit present.     Mental Status: She is alert and oriented to person, place, and time. Mental status is at baseline.     (all labs ordered are listed, but only abnormal results are displayed) Labs Reviewed  CBC WITH DIFFERENTIAL/PLATELET  BASIC METABOLIC PANEL WITH GFR  MAGNESIUM    EKG: None  Radiology: No results found.   Procedures   Medications Ordered in the ED  sodium chloride  0.9 % bolus 1,000 mL (has no administration in time range)                                    Medical Decision Making Amount and/or Complexity of Data Reviewed Labs: ordered.   Medical Decision Making / ED Course   This patient presents to the ED for concern of fatigue, this involves an extensive number of treatment options, and is a complaint that carries with it a high risk of complications and morbidity.  The differential diagnosis includes viral URI, TIA, CVA, nerve impingement  MDM: 34 year old female presents today for concern of fatigue.  Has history of anemia and other medical history as noted above.  She has a PCP appointment but was concerned and would like some blood work done in the meantime.  Exam today is reassuring.  Will obtain some blood work and provide her with some fluids and  reevaluate.  CBC without leukocytosis.  Mild anemia 10.9 but this is around her baseline.  BMP without acute concern.   Patient drinking fanta.  Unopened bottle of water at bedside.  Discussed how soft drinks can further dehydrate her.  Discussed focusing on electrolyte drinks and water. Patient also has significant anxiety and currently has significant stress particularly with her father's health. Discussed the reassuring workup.  Discussed following up with her PCP.  Patient discharged in stable condition.  Return precaution discussed.  Patient voices understanding and is in agreement with plan.   Lab Tests: -I ordered, reviewed, and interpreted  labs.   The pertinent results include:   Labs Reviewed  CBC WITH DIFFERENTIAL/PLATELET - Abnormal; Notable for the following components:      Result Value   Hemoglobin 10.9 (*)    HCT 35.2 (*)    MCV 73.2 (*)    MCH 22.7 (*)    RDW 16.3 (*)    All other components within normal limits  BASIC METABOLIC PANEL WITH GFR - Abnormal; Notable for the following components:   CO2 21 (*)    Glucose, Bld 117 (*)    All other components within normal limits  MAGNESIUM      EKG  EKG Interpretation Date/Time:    Ventricular Rate:    PR Interval:    QRS Duration:    QT Interval:    QTC Calculation:   R Axis:      Text Interpretation:            Medicines ordered and prescription drug management: Meds ordered this encounter  Medications   sodium chloride  0.9 % bolus 1,000 mL    -I have reviewed the patients home medicines and have made adjustments as needed  Social Determinants of Health:  Factors impacting patients care include: Has close PCP follow up established   Reevaluation: After the interventions noted above, I reevaluated the patient and found that they have :stayed the same  Co morbidities that complicate the patient evaluation  Past Medical History:  Diagnosis Date   Anxiety    Depression    Diabetes mellitus  without complication (HCC)    Hypertension    Personality disorder (HCC)       Dispostion: Discharged in stable condition.  Return precaution discussed.  Patient voices understanding and is in agreement with plan.  Final diagnoses:  Dehydration  Fatigue, unspecified type    ED Discharge Orders     None          Hildegard Loge, [place] 11/29/24 1725  "

## 2024-11-29 NOTE — Discharge Instructions (Signed)
 Your workup, exam, vital signs were all reassuring.  Please follow-up with your primary care doctor on Monday as scheduled.  Please drink lots of fluids.  Reduce caffeine intake through coffee or sodas.

## 2024-11-29 NOTE — ED Triage Notes (Addendum)
 Pt reports weakness and soreness in BUE and feeling sluggish and tired x 3 weeks, reports upcoming appt w PCP on Monday

## 2025-02-19 ENCOUNTER — Ambulatory Visit: Admitting: Neurology
